# Patient Record
Sex: Female | Born: 1970 | Hispanic: Yes | Marital: Married | Smoking: Never smoker
Health system: Southern US, Community
[De-identification: ages and names within clinical notes are randomized; demographics above are authoritative.]

## PROBLEM LIST (undated history)

## (undated) HISTORY — PX: FOOT SURGERY: SHX648

---

## 2006-03-16 ENCOUNTER — Ambulatory Visit: Payer: Self-pay | Admitting: Internal Medicine

## 2006-03-31 ENCOUNTER — Ambulatory Visit: Payer: Self-pay | Admitting: Internal Medicine

## 2006-03-31 ENCOUNTER — Encounter: Payer: Self-pay | Admitting: Internal Medicine

## 2006-05-09 ENCOUNTER — Ambulatory Visit: Payer: Self-pay | Admitting: Family Medicine

## 2006-05-11 ENCOUNTER — Ambulatory Visit: Payer: Self-pay | Admitting: Family Medicine

## 2006-06-19 ENCOUNTER — Ambulatory Visit: Payer: Self-pay | Admitting: Family Medicine

## 2006-09-01 ENCOUNTER — Ambulatory Visit: Payer: Self-pay | Admitting: Internal Medicine

## 2007-03-09 ENCOUNTER — Emergency Department (HOSPITAL_COMMUNITY): Admission: EM | Admit: 2007-03-09 | Discharge: 2007-03-09 | Payer: Self-pay | Admitting: Emergency Medicine

## 2007-07-13 DIAGNOSIS — M6740 Ganglion, unspecified site: Secondary | ICD-10-CM | POA: Insufficient documentation

## 2007-09-06 ENCOUNTER — Other Ambulatory Visit: Admission: RE | Admit: 2007-09-06 | Discharge: 2007-09-06 | Payer: Self-pay | Admitting: Gynecology

## 2007-09-21 ENCOUNTER — Inpatient Hospital Stay (HOSPITAL_COMMUNITY): Admission: AD | Admit: 2007-09-21 | Discharge: 2007-09-21 | Payer: Self-pay | Admitting: Obstetrics & Gynecology

## 2007-09-23 ENCOUNTER — Inpatient Hospital Stay (HOSPITAL_COMMUNITY): Admission: AD | Admit: 2007-09-23 | Discharge: 2007-09-23 | Payer: Self-pay | Admitting: Obstetrics and Gynecology

## 2007-09-25 ENCOUNTER — Inpatient Hospital Stay (HOSPITAL_COMMUNITY): Admission: AD | Admit: 2007-09-25 | Discharge: 2007-09-25 | Payer: Self-pay | Admitting: Obstetrics & Gynecology

## 2007-10-02 ENCOUNTER — Inpatient Hospital Stay (HOSPITAL_COMMUNITY): Admission: RE | Admit: 2007-10-02 | Discharge: 2007-10-02 | Payer: Self-pay | Admitting: Obstetrics & Gynecology

## 2007-10-05 ENCOUNTER — Encounter: Payer: Self-pay | Admitting: Obstetrics & Gynecology

## 2007-10-05 ENCOUNTER — Ambulatory Visit (HOSPITAL_COMMUNITY): Admission: RE | Admit: 2007-10-05 | Discharge: 2007-10-05 | Payer: Self-pay | Admitting: Obstetrics & Gynecology

## 2007-10-05 ENCOUNTER — Ambulatory Visit: Payer: Self-pay | Admitting: Obstetrics & Gynecology

## 2007-10-11 ENCOUNTER — Inpatient Hospital Stay (HOSPITAL_COMMUNITY): Admission: AD | Admit: 2007-10-11 | Discharge: 2007-10-11 | Payer: Self-pay | Admitting: Obstetrics and Gynecology

## 2007-11-02 ENCOUNTER — Ambulatory Visit: Payer: Self-pay | Admitting: Gynecology

## 2007-12-25 ENCOUNTER — Ambulatory Visit: Payer: Self-pay | Admitting: Internal Medicine

## 2007-12-25 DIAGNOSIS — L723 Sebaceous cyst: Secondary | ICD-10-CM

## 2007-12-25 DIAGNOSIS — L293 Anogenital pruritus, unspecified: Secondary | ICD-10-CM | POA: Insufficient documentation

## 2007-12-25 LAB — CONVERTED CEMR LAB
KOH Prep: NEGATIVE
Whiff Test: NEGATIVE

## 2008-07-28 IMAGING — CR DG ABDOMEN ACUTE W/ 1V CHEST
3 series · 3 of 3 positions shown · non-contrast
Comparison: None.

CLINICAL DATA: Abdominal pain, question constipation.  
 ACUTE ABDOMINAL SERIES:

[view not recorded (1 of 3)]
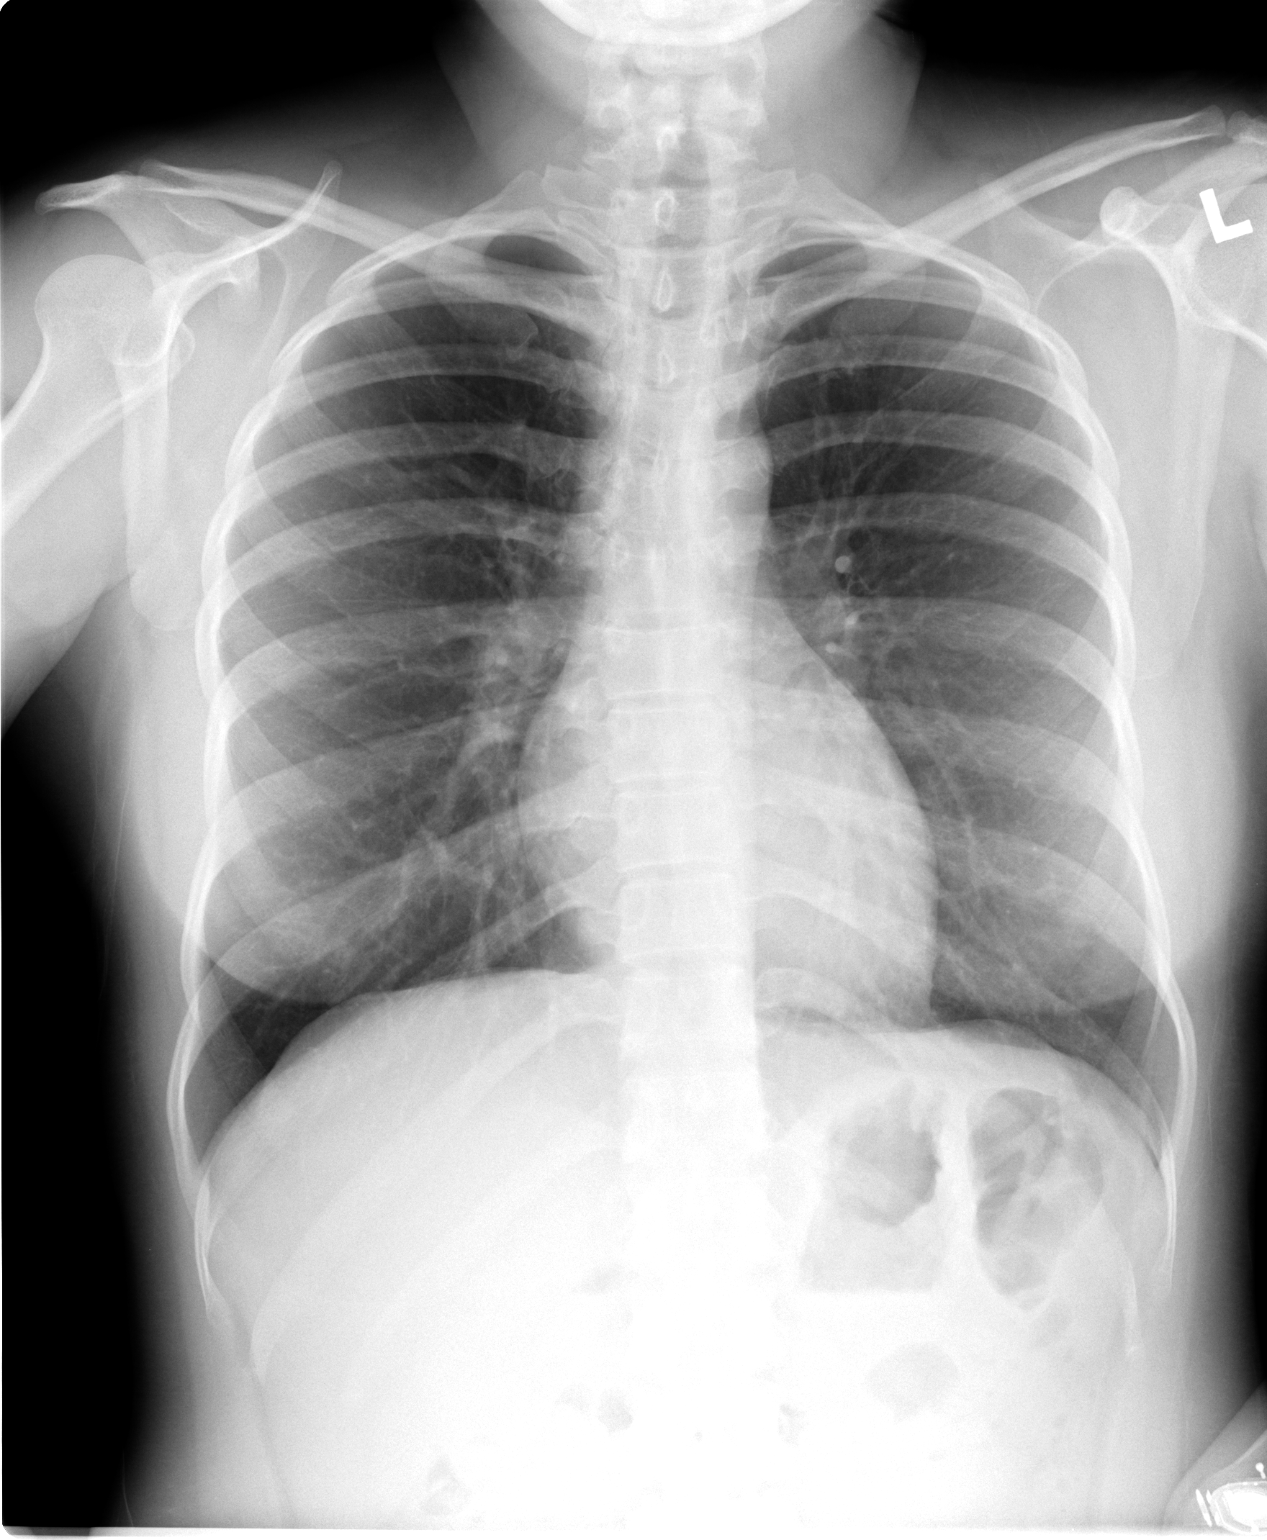

[view not recorded (2 of 3)]
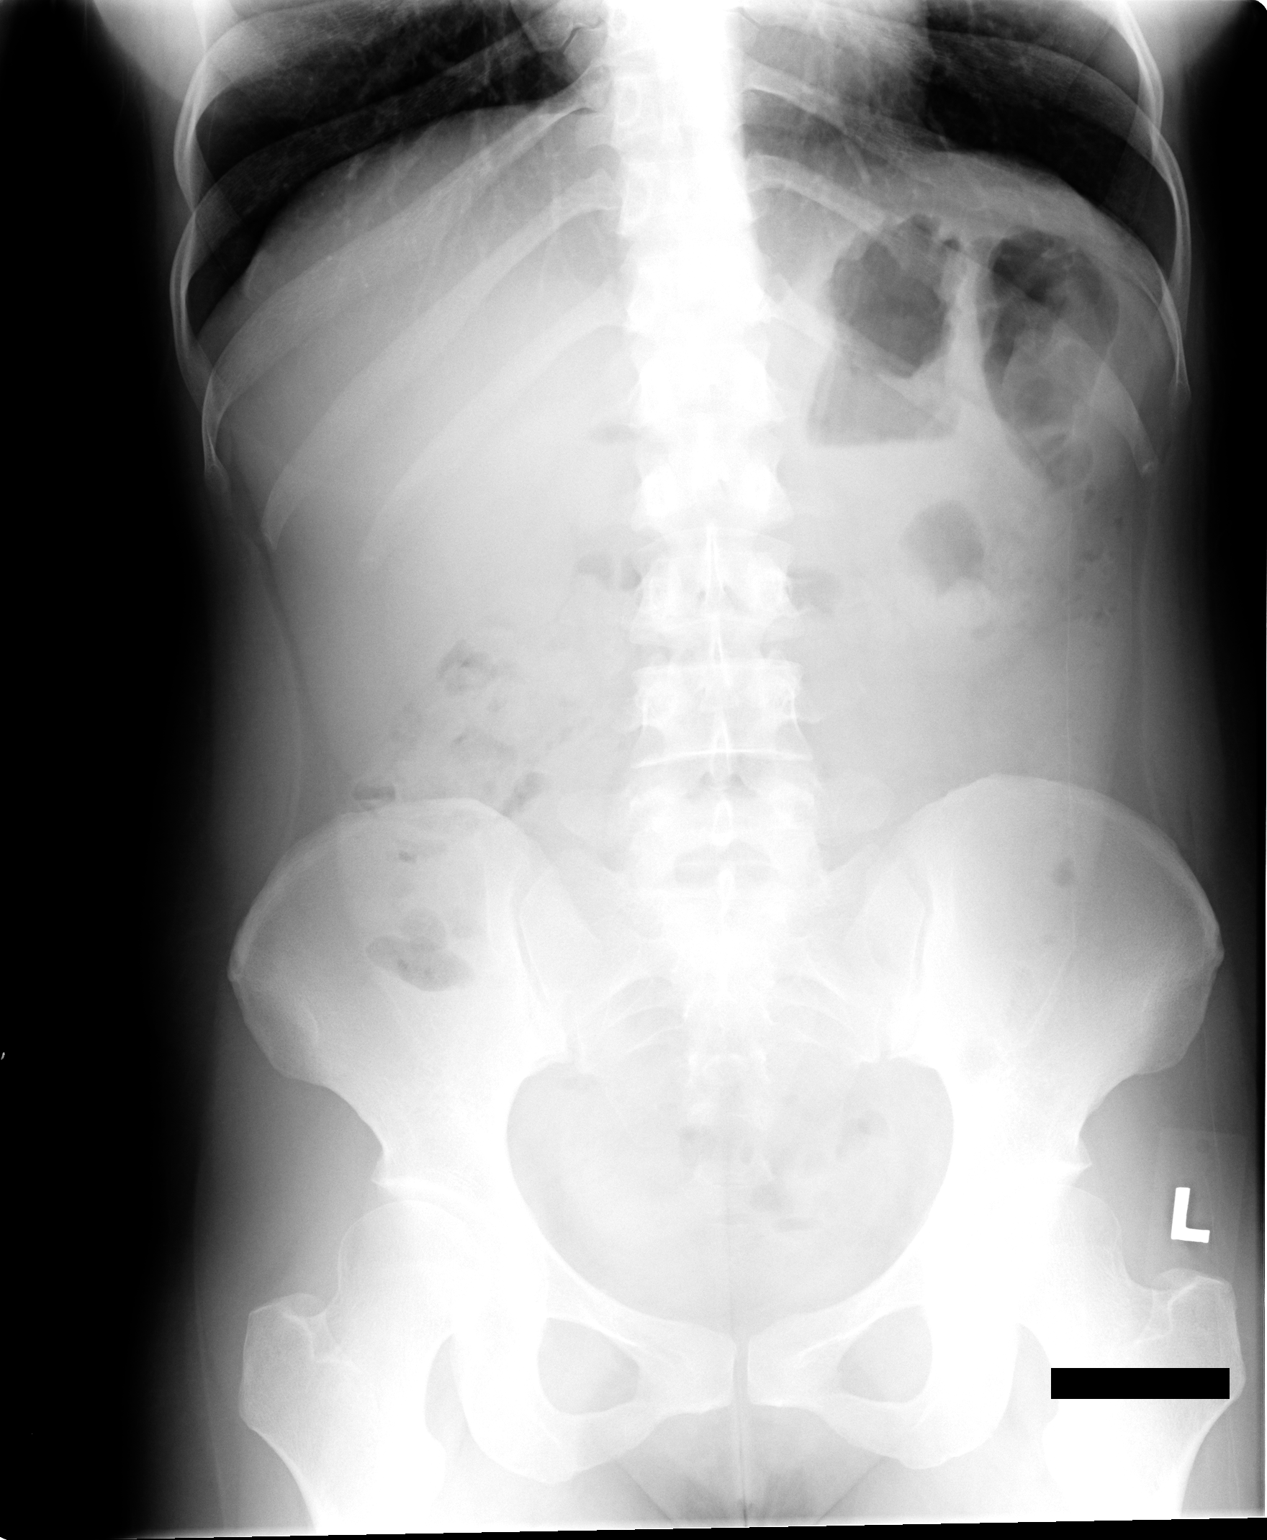

[view not recorded (3 of 3)]
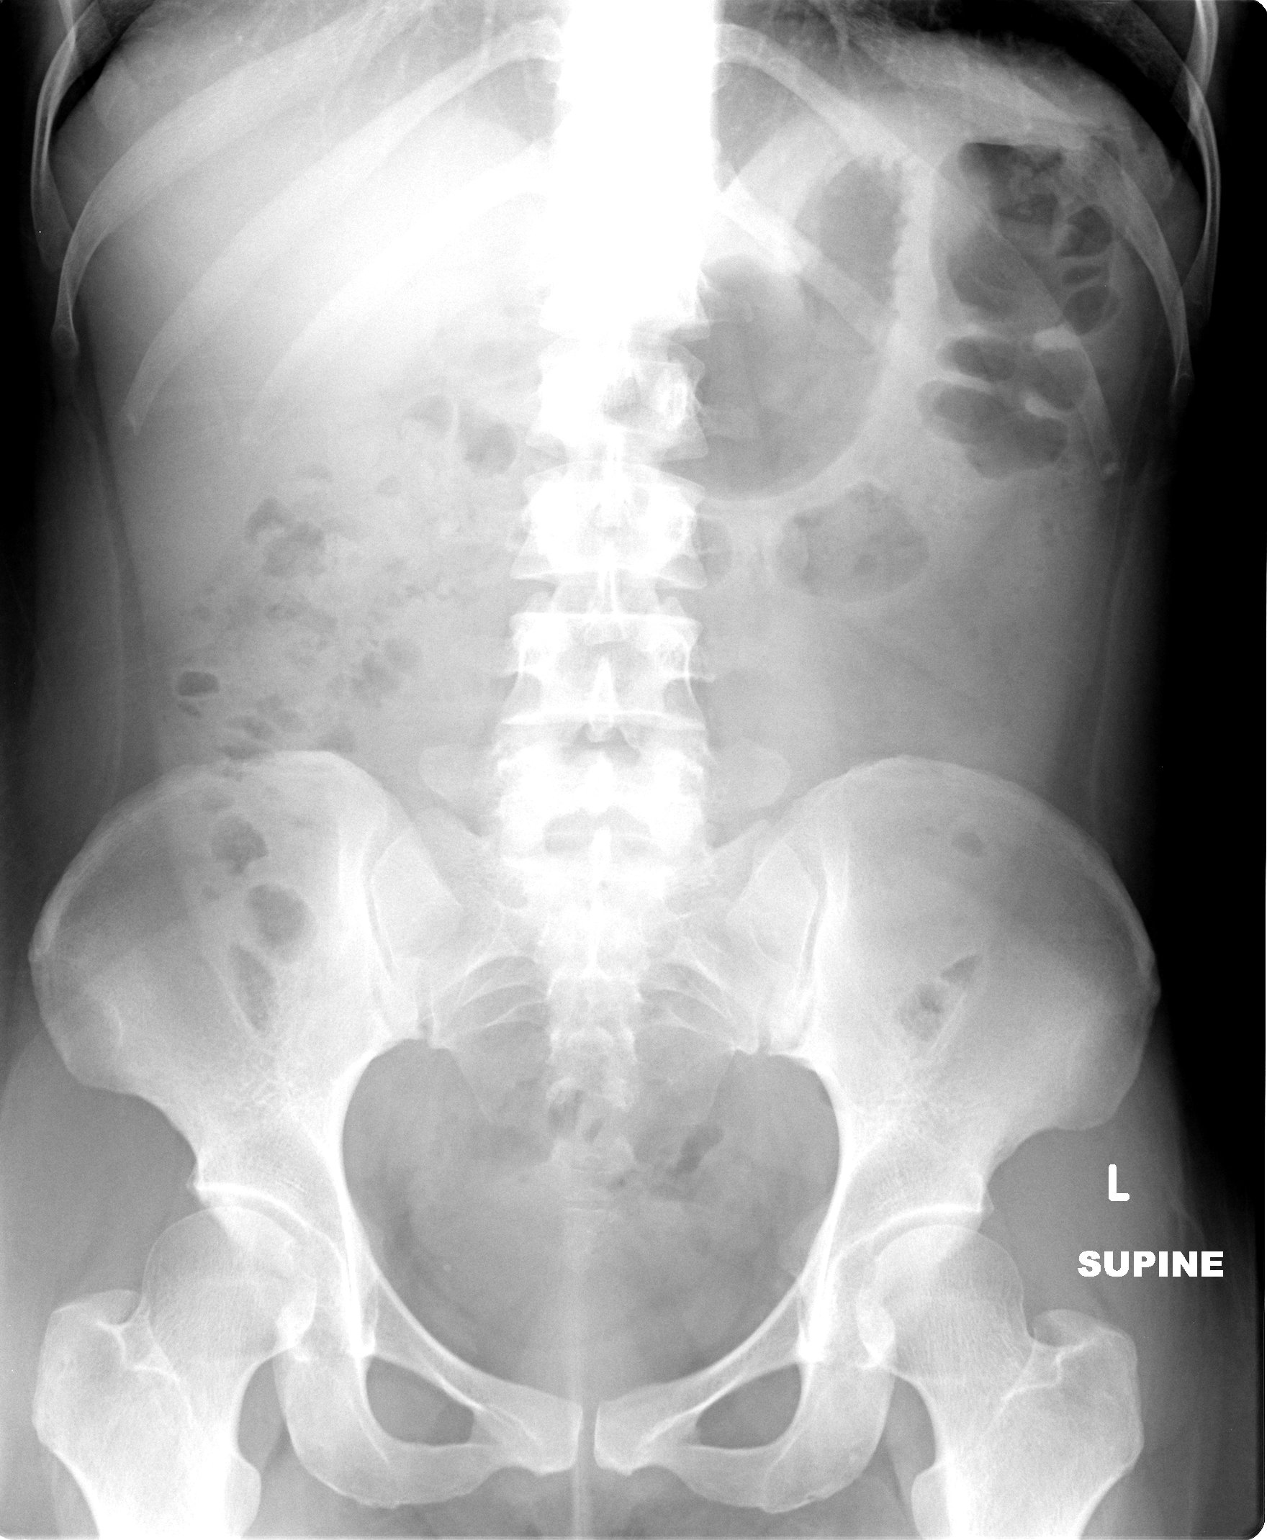

[3 of 3 positions shown; findings below may reference images not displayed]

FINDINGS: Single view of chest demonstrates clear lungs, normal heart size and no pleural effusion.  
 Two views of the abdomen demonstrate a normal bowel gas pattern and free intraperitoneal air.  Bowel gas pattern is normal with a mild to moderate stool volume.  No unexpected abdominal calcifications.
IMPRESSION: 1.  No acute finding.
 2.  Mild to moderate stool volume.

## 2008-12-01 ENCOUNTER — Inpatient Hospital Stay (HOSPITAL_COMMUNITY): Admission: AD | Admit: 2008-12-01 | Discharge: 2008-12-01 | Payer: Self-pay | Admitting: Obstetrics & Gynecology

## 2008-12-16 ENCOUNTER — Inpatient Hospital Stay (HOSPITAL_COMMUNITY): Admission: RE | Admit: 2008-12-16 | Discharge: 2008-12-20 | Payer: Self-pay | Admitting: Obstetrics

## 2008-12-17 ENCOUNTER — Encounter: Payer: Self-pay | Admitting: Obstetrics

## 2008-12-25 ENCOUNTER — Inpatient Hospital Stay (HOSPITAL_COMMUNITY): Admission: AD | Admit: 2008-12-25 | Discharge: 2008-12-29 | Payer: Self-pay | Admitting: Obstetrics & Gynecology

## 2011-02-15 LAB — CBC
HCT: 20.1 % — ABNORMAL LOW (ref 36.0–46.0)
HCT: 21.4 % — ABNORMAL LOW (ref 36.0–46.0)
HCT: 25.6 % — ABNORMAL LOW (ref 36.0–46.0)
HCT: 32.1 % — ABNORMAL LOW (ref 36.0–46.0)
Hemoglobin: 11.3 g/dL — ABNORMAL LOW (ref 12.0–15.0)
Hemoglobin: 6.9 g/dL — CL (ref 12.0–15.0)
Hemoglobin: 7.2 g/dL — CL (ref 12.0–15.0)
MCHC: 33.6 g/dL (ref 30.0–36.0)
MCHC: 33.7 g/dL (ref 30.0–36.0)
MCHC: 33.9 g/dL (ref 30.0–36.0)
MCHC: 34 g/dL (ref 30.0–36.0)
MCHC: 34.5 g/dL (ref 30.0–36.0)
MCV: 97.9 fL (ref 78.0–100.0)
MCV: 98.1 fL (ref 78.0–100.0)
MCV: 98.3 fL (ref 78.0–100.0)
MCV: 98.3 fL (ref 78.0–100.0)
MCV: 98.8 fL (ref 78.0–100.0)
Platelets: 183 10*3/uL (ref 150–400)
Platelets: 333 10*3/uL (ref 150–400)
RBC: 2.05 MIL/uL — ABNORMAL LOW (ref 3.87–5.11)
RBC: 2.18 MIL/uL — ABNORMAL LOW (ref 3.87–5.11)
RBC: 2.48 MIL/uL — ABNORMAL LOW (ref 3.87–5.11)
RBC: 3.28 MIL/uL — ABNORMAL LOW (ref 3.87–5.11)
RBC: 3.44 MIL/uL — ABNORMAL LOW (ref 3.87–5.11)
RDW: 14.7 % (ref 11.5–15.5)
RDW: 14.8 % (ref 11.5–15.5)
RDW: 15.4 % (ref 11.5–15.5)
WBC: 18.9 10*3/uL — ABNORMAL HIGH (ref 4.0–10.5)
WBC: 6.7 10*3/uL (ref 4.0–10.5)

## 2011-02-15 LAB — COMPREHENSIVE METABOLIC PANEL
AST: 16 U/L (ref 0–37)
Alkaline Phosphatase: 127 U/L — ABNORMAL HIGH (ref 39–117)
BUN: 17 mg/dL (ref 6–23)
CO2: 22 mEq/L (ref 19–32)
Calcium: 8.6 mg/dL (ref 8.4–10.5)
Creatinine, Ser: 0.55 mg/dL (ref 0.4–1.2)
GFR calc Af Amer: >60 mL/min (ref >60)
GFR calc non Af Amer: >60 mL/min (ref >60)
GFR calc non Af Amer: >60 mL/min (ref >60)
Glucose, Bld: 171 mg/dL — ABNORMAL HIGH (ref 70–99)
Glucose, Bld: 85 mg/dL (ref 70–99)
Potassium: 3.9 mEq/L (ref 3.5–5.1)
Sodium: 130 mEq/L — ABNORMAL LOW (ref 135–145)
Total Bilirubin: 0.3 mg/dL (ref 0.3–1.2)
Total Protein: 5.6 g/dL — ABNORMAL LOW (ref 6.0–8.3)

## 2011-02-15 LAB — DIFFERENTIAL
Basophils Absolute: 0 10*3/uL (ref 0.0–0.1)
Basophils Absolute: 0 10*3/uL (ref 0.0–0.1)
Basophils Relative: 0 % (ref 0–1)
Basophils Relative: 0 % (ref 0–1)
Eosinophils Absolute: 0.1 10*3/uL (ref 0.0–0.7)
Eosinophils Relative: 1 % (ref 0–5)
Lymphocytes Relative: 14 % (ref 12–46)
Lymphocytes Relative: 16 % (ref 12–46)
Monocytes Absolute: 0.4 10*3/uL (ref 0.1–1.0)
Monocytes Relative: 4 % (ref 3–12)
Monocytes Relative: 5 % (ref 3–12)
Neutro Abs: 9.1 10*3/uL — ABNORMAL HIGH (ref 1.7–7.7)
Neutrophils Relative %: 78 % — ABNORMAL HIGH (ref 43–77)

## 2011-02-15 LAB — TYPE AND SCREEN: Antibody Screen: NEGATIVE

## 2011-02-15 LAB — URIC ACID: Uric Acid, Serum: 3.7 mg/dL (ref 2.4–7.0)

## 2011-02-15 LAB — BASIC METABOLIC PANEL
BUN: 11 mg/dL (ref 6–23)
GFR calc non Af Amer: >60 mL/min (ref >60)
Glucose, Bld: 169 mg/dL — ABNORMAL HIGH (ref 70–99)
Potassium: 3.9 mEq/L (ref 3.5–5.1)
Sodium: 139 mEq/L (ref 135–145)

## 2011-02-15 LAB — RPR: RPR Ser Ql: NONREACTIVE

## 2011-03-15 NOTE — Discharge Summary (Signed)
NAME:  Kelly Rogers, Kelly Rogers   ACCOUNT NO.:  [id number]   MEDICAL RECORD NO.:  [id number]          PATIENT TYPE:  INP   LOCATION:  9114                          FACILITY:  [facility]   PHYSICIAN:  Charles A. Clearance Coots, M.D.DATE OF BIRTH:  11/17/1970   DATE OF ADMISSION:  12/16/2008  DATE OF DISCHARGE:  12/20/2008                               DISCHARGE SUMMARY   ADMITTING DIAGNOSES:  Post dates pregnancy, induction of labor.   DISCHARGE DIAGNOSES:  Post dates pregnancy, induction of labor, status  post primary low transverse cesarean section on December 17, 2008 for  arrest of descent.  Viable female infant was delivered at 0938, Apgars  of 9 at 1 minute, 9 at 5 minutes, weight of 35, 50 g, length of 53.34  cm.  Mother and infant discharged home in good condition.   REASON FOR ADMISSION:  A 40 year old G2, P0, estimated date of  confinement of December 12, 2008 presents for induction of labor for  postdates.  Prenatal care was uncomplicated.  Group B strep negative.   PAST MEDICAL HISTORY:  Surgery cyst on foot resected.   ILLNESSES:  None.   MEDICATIONS:  Prenatal vitamins.   ALLERGIES:  No known drug allergies.   SOCIAL HISTORY:  Married.  Negative tobacco, alcohol, or recreational  drug use.   FAMILY HISTORY:  Positive for diabetes, colon cancer, and renal disease.   PHYSICAL EXAMINATION:  GENERAL:  Well-nourished, well-developed female  in no acute distress.  Afebrile.  VITAL SIGNS:  Stable.  LUNGS:  Clear to auscultation bilaterally.  HEART:  Regular rate and rhythm.  ABDOMEN:  Gravid, nontender.  Cervix long, closed, and vertex at -3  station.   ADMITTING LABS:  Hemoglobin 11, hematocrit 33, white blood cell count  6700, platelets 180,000.  Comprehensive metabolic panel was within  normal limits.  RPR was nonreactive.   HOSPITAL COURSE:  The patient was admitted and cervical ripening was  started with Foley bulb which was inserted in routine fashion.  The  patient was  ruptured after Foley bulb was removed and cervix was 4-5 cm  dilated, 60-70% effaced, and the vertex was still at -3 station.  The  patient continued in active labor to full to 9 cm dilatation and made no  further progress with either dilation or descent for greater than 3-4  hours after that point with adequate labor.  A decision was made to  proceed with cesarean section delivery for arrest of descent.  Primary  low transverse cesarean section was performed on December 17, 2008.  There were no intraoperative complications.  Postoperative course was  uncomplicated.  The patient was discharged home on postop day #3 in good  condition.   DISCHARGE LABS:  Hemoglobin 8.7, hematocrit 25.6, white blood cell count  9800, platelets 149,000.  The patient did have anemia postoperatively,  but had no significant orthostatic changes and was able to ambulate and  take care of the baby without any dizziness, or weakness, or headache.  She therefore did not require transfusion of blood products and was  discharged home on iron therapy.   DISCHARGE DISPOSITION:   MEDICATIONS:  Percocet was prescribed for pain.  Continue prenatal  vitamins.  Iron was prescribed for anemia.  Routine written instructions  were given for discharge after cesarean section.  The patient is to call  office for followup appointment in 2 weeks      Charles A. Clearance Coots, M.D.  Electronically Signed     CAH/MEDQ  D:  12/20/2008  T:  12/20/2008  Job:  [id number]

## 2011-03-15 NOTE — Op Note (Signed)
NAME:  KINDALL, SWABY   ACCOUNT NO.:  [id number]   MEDICAL RECORD NO.:  [id number]          PATIENT TYPE:  AMB   LOCATION:  SDC                           FACILITY:  [facility]   PHYSICIAN:  Norton Blizzard, MD    DATE OF BIRTH:  1971/01/19   DATE OF PROCEDURE:  10/05/2007  DATE OF DISCHARGE:                               OPERATIVE REPORT   PREOPERATIVE DIAGNOSIS:  Missed abortion at six weeks.   POSTOPERATIVE DIAGNOSIS:  Missed abortion at six weeks.   PROCEDURE:  Suction dilation and curettage.   SURGEON:  Norton Blizzard, M.D.   ANESTHESIA:  General and local.   IV FLUIDS:  850 cc of lactated Ringer's.   ESTIMATED BLOOD LOSS:  Minimal.   FINDINGS:  A six week size anteverted uterus, a moderate amount of  products of conception.   SPECIMENS:  Products of conception sent to pathology.   COMPLICATIONS:  None.   PROCEDURE IN DETAIL:  The patient is a 40 year old woman who was  recently diagnosed with missed abortion at six weeks.  Patient opted for  surgical management.  The risks, benefits, indications, and alternatives  for surgery were discussed with the patient, and written informed  consent was obtained.  Patient was taken to the operating room where  general anesthesia was placed and found to be adequate.  Of note, she  did receive doxycycline 100 mg IV x1 prior to the procedure.  She was  then placed in a dorsal lithotomy position and prepped and draped in a  sterile manner.  A Foley catheter was used to empty the patient's  bladder.  Attention was then turned to the patient's vagina, where a  speculum was placed.  The cervix was identified and grasped with a  tenaculum.  A paracervical block was administered using 20 cc of 0.25%  Marcaine with epinephrine.  The cervical os was then dilated to  accommodate 7 mm curette.  This curette was gently advanced into the  uterus fundus, and a suction machine was activated.  The curette was  slowly rotated to clear the  uterus of all products of conception and  debris.  About two passes were made, and the uterus was noted to be  clamped down.  Sharp curettage was then performed to confirm emptying of  the uterus, and a suction curette was introduced one last time to clear  all of the blood and clots and remaining products.  The tenaculum was removed.  Overall good hemostasis was noted  All  instruments were removed from the patient's vagina.  Patient tolerated  the procedure well.  Sponge, needle, lap counts were correct x2.  Patient was taken to the recovery room in stable condition.      Norton Blizzard, MD  Electronically Signed     UAD/MEDQ  D:  10/05/2007  T:  10/05/2007  Job:  [id number]

## 2011-03-15 NOTE — Op Note (Signed)
NAME:  Kelly Rogers, Kelly Rogers   ACCOUNT NO.:  [id number]   MEDICAL RECORD NO.:  [id number]          PATIENT TYPE:  INP   LOCATION:  9114                          FACILITY:  [facility]   PHYSICIAN:  Charles A. Clearance Coots, M.D.DATE OF BIRTH:  June 07, 1971   DATE OF PROCEDURE:  12/17/2008  DATE OF DISCHARGE:  12/20/2008                               OPERATIVE REPORT   PREOPERATIVE DIAGNOSIS:  Arrest of descent.   POSTOPERATIVE DIAGNOSIS:  Arrest of descent.   PROCEDURE:  Primary low-transverse cesarean section.   SURGEON:  Charles A. Clearance Coots, MD   ANESTHESIA:  Epidural.   ESTIMATED BLOOD LOSS:  1000 mL.   IV FLUIDS:  2600 mL.   URINE OUTPUT:  300 mL Foley to gravity.   FINDINGS:  Viable female at 0938, Apgars of 9 at one minute and 9 at  five minutes, weight of 7 pounds and 13 ounces.  Normal uterus, ovaries,  and fallopian tubes.   COMPLICATIONS:  None.   SPECIMEN:  Placenta.   DISPOSITION:  Specimen to Pathology.   OPERATION:  The patient was brought to the operating room and after  satisfactory redosing of the epidural, the abdomen was prepped and  draped in the usual sterile fashion.  Pfannenstiel skin incision was  made with a scalpel that was deepened down to the fascia with a scalpel.  Fascia was nicked in the midline and the fascial incision was extended  to the left and to right with curved Mayo scissors.  The superior and  inferior fascial edges were taken off of the rectus muscles both blunt  and sharp dissection.  The rectus muscle was bluntly and sharply divided  in the midline.  Peritoneum was entered digitally and was digitally  extended to the left and to the right.  The bladder blade was positioned  in the incision and the vesicouterine fold of peritoneum above the  reflection of the urinary bladder was grasped with forceps and was  incised and undermined with Metzenbaum scissors.  The incision was  extended to the left and to the right with Metzenbaum scissors.   The  bladder flap was bluntly developed and the bladder blade was  repositioned in front of the urinary bladder placing it well out of the  operative field.  The uterus was then entered transversely in the lower  uterine segment with a scalpel.  Clear amniotic fluid was expelled.  The  uterine incision was extended to the left and to the right digitally.  The vertex was then brought up into the incision and the vertex was  delivered with the aid of fundal pressure from the assistant.  The  infant's mouth and nose were suctioned with a suction bulb and the  delivery was completed with the aid of fundal pressure from the  assistant.  The umbilical cord was doubly clamped and cut and the infant  was handed off to the nursery staff.  Cord blood was obtained and the  placenta was spontaneously expelled from the uterine cavity intact.  The  endometrial surface was thoroughly debrided with a dry lap sponge.  The  edges of the uterine incision were grasped with ring forceps.  The  uterus was closed with continuous interlocking suture of 0-Monocryl from  each corner to the center.  Hemostasis was excellent.  The pelvic cavity  was thoroughly irrigated with warm saline solution and all clots were  removed.  The abdomen was then closed as follows.  The parietal  peritoneum was closed with continuous suture of 2-0 Monocryl.  The  fascia was closed with continuous suture of 0-Vicryl.  Subcutaneous  tissue was thoroughly irrigated with warm saline solution.  All areas of  subcutaneous bleeding were coagulated with the Bovie.  Skin was then  closed with surgical stainless steel staples.  Sterile bandage was  applied to the incision closure.  The surgical technician indicated that  all needle, sponge, and instrument counts were correct x2.  The patient  tolerated the procedure well, transported to recovery room in  satisfactory condition.      Charles A. Clearance Coots, M.D.  Electronically Signed      CAH/MEDQ  D:  02/09/2009  T:  02/10/2009  Job:  [id number]

## 2011-03-15 NOTE — Discharge Summary (Signed)
NAME:  Kelly Rogers, Kelly Rogers   ACCOUNT NO.:  [id number]   MEDICAL RECORD NO.:  [id number]          PATIENT TYPE:  INP   LOCATION:  9319                          FACILITY:  [facility]   PHYSICIAN:  Charles A. Clearance Coots, M.D.DATE OF BIRTH:  Oct 29, 1971   DATE OF ADMISSION:  12/25/2008  DATE OF DISCHARGE:  12/29/2008                               DISCHARGE SUMMARY   ADMITTING DIAGNOSES:  Uterine subinvolution postpartum, status post  cesarean section.   DISCHARGE DIAGNOSES:  Uterine subinvolution postpartum, status post  cesarean section, much improved after uterotonic therapy, discharged  home in good condition.   REASON FOR ADMISSION:  A 40 year old female presented with a chief  complaint of vaginal bleeding.  The patient is G2, P1-0-1-1, status post  cesarean section delivery on December 17, 2008.  The patient had  bleeding off and on since delivery, but on the day of admission began  having heavy bleeding per vagina passing clots and with cramping.  The  patient stated that she was feeling weak and dizzy.  On admission prior  to her cesarean section, the patient's hemoglobin was 11 and on  discharge after cesarean section, hemoglobin was 8.7.  The patient was  discharged home on iron.   PAST MEDICAL HISTORY:  Surgery, cesarean section, D and C, left foot  surgery.   ILLNESSES:  None.   MEDICATIONS:  Prenatal vitamins, iron, Percocet, and ibuprofen.   ALLERGIES:  No known drug allergies.   SOCIAL HISTORY:  Married.  Negative tobacco, alcohol, or recreational  drug use.   FAMILY HISTORY:  Positive for diabetes and hypertension.   PHYSICAL EXAMINATION:  GENERAL:  A well-nourished, well-developed female  in no acute distress.  VITAL SIGNS:  She is afebrile.  Vital signs were stable.  LUNGS:  Clear to auscultation bilaterally.  HEART:  Regular rate and rhythm.  ABDOMEN:  Diffuse tenderness.  SKIN:  Incision appears clean, dry, intact, and nontender.  PELVIC:  Moderate amount of  blood in the vagina with large amount of  clots removed.  Uterus was enlarged and tender.   ADMITTING LABS:  Hemoglobin 7.2, hematocrit 21.4, white blood cell count  18,900, and platelets 363,000.  Comprehensive metabolic panel was within  normal limits except for elevated glucose.   HOSPITAL COURSE:  The patient was admitted and transfused 2 units of  packed red blood cells.  She was also started on Unasyn IV for possible  endometritis.  Hemabate was also given for the uterine subinvolution.  The patient responded well to therapy.  Ultrasound was obtained and was  suggestive of retained products of conception, but the patient did well  clinically with no further vaginal bleeding after admission and D and C  was not done.  She continued to do well with no further vaginal bleeding  and post-transfusion hemoglobin was 8 and her hematocrit was 23.  The  patient remained stable and had no further vaginal bleeding and was  therefore discharged home on hospital day #7, much improved, in good  condition.   DISCHARGE LABS:  Hemoglobin 8, hematocrit 23.6, white blood cell count  11,700, and platelets 380,000.   DISCHARGE DISPOSITION:  Medications:  Continue prenatal  vitamins.  Iron  was prescribed for anemia.  Continue ibuprofen and Percocet for pain.  The patient was given precautions for heavy vaginal bleeding to return  to the hospital if the onset of heavy vaginal bleeding occurs.  She is  to call office for followup appointment in 1 week.      Charles A. Clearance Coots, M.D.  Electronically Signed     CAH/MEDQ  D:  02/09/2009  T:  02/10/2009  Job:  [id number]

## 2011-08-08 LAB — CBC
Hemoglobin: 12.4
RBC: 3.81 — ABNORMAL LOW

## 2011-08-09 LAB — POCT PREGNANCY, URINE
Operator id: 220991
Preg Test, Ur: POSITIVE

## 2011-08-09 LAB — CBC
MCHC: 35.4
MCV: 93.2
RBC: 3.5 — ABNORMAL LOW

## 2011-08-09 LAB — URINE MICROSCOPIC-ADD ON

## 2011-08-09 LAB — WET PREP, GENITAL: Yeast Wet Prep HPF POC: NONE SEEN

## 2011-08-09 LAB — URINALYSIS, ROUTINE W REFLEX MICROSCOPIC
Leukocytes, UA: NEGATIVE
Nitrite: NEGATIVE
Specific Gravity, Urine: 1.015
Urobilinogen, UA: 0.2

## 2011-08-09 LAB — DIFFERENTIAL
Basophils Relative: 1
Monocytes Absolute: 0.4
Monocytes Relative: 9
Neutro Abs: 2.1

## 2011-08-09 LAB — HCG, QUANTITATIVE, PREGNANCY
hCG, Beta Chain, Quant, S: 7739 — ABNORMAL HIGH
hCG, Beta Chain, Quant, S: 7898 — ABNORMAL HIGH

## 2011-08-09 LAB — CREATININE, SERUM: GFR calc Af Amer: >60

## 2011-08-09 LAB — GC/CHLAMYDIA PROBE AMP, GENITAL: GC Probe Amp, Genital: NEGATIVE

## 2012-05-07 ENCOUNTER — Encounter (HOSPITAL_COMMUNITY): Payer: Self-pay | Admitting: Emergency Medicine

## 2012-05-07 ENCOUNTER — Emergency Department (HOSPITAL_COMMUNITY): Admission: EM | Admit: 2012-05-07 | Discharge: 2012-05-07 | Discharge status: Absent without leave | Payer: Self-pay

## 2012-05-07 ENCOUNTER — Emergency Department (INDEPENDENT_AMBULATORY_CARE_PROVIDER_SITE_OTHER)
Admission: EM | Admit: 2012-05-07 | Discharge: 2012-05-07 | Disposition: A | Discharge status: Home / Self Care | Payer: Self-pay | Source: Home / Self Care | Attending: Emergency Medicine | Admitting: Emergency Medicine

## 2012-05-07 DIAGNOSIS — K8020 Calculus of gallbladder without cholecystitis without obstruction: Secondary | ICD-10-CM

## 2012-05-07 LAB — POCT URINALYSIS DIP (DEVICE)
Hgb urine dipstick: NEGATIVE
Ketones, ur: NEGATIVE mg/dL
Leukocytes, UA: NEGATIVE
Protein, ur: NEGATIVE mg/dL
Specific Gravity, Urine: 1.01 (ref 1.005–1.030)
pH: 5.5 (ref 5.0–8.0)

## 2012-05-07 MED ORDER — ONDANSETRON 8 MG PO TBDP: ORAL_TABLET | ORAL | Status: AC

## 2012-05-07 MED ORDER — IBUPROFEN 600 MG PO TABS: 600.0000 mg | ORAL_TABLET | Freq: Four times a day (QID) | ORAL | Status: AC | PRN

## 2012-05-07 NOTE — ED Notes (Signed)
Pt here with intermit right mid abdominal sharp pain radiating to lower back that started x ago.denies n/v/d.lmp 04/22/12.

## 2012-05-07 NOTE — ED Provider Notes (Signed)
History     CSN: [id number]  Arrival date & time 05/07/12  1228   First MD Initiated Contact with Patient 05/07/12 1304      Chief Complaint  Patient presents with  . Abdominal Pain  . Back Pain    (Consider location/radiation/quality/duration/timing/severity/associated sxs/prior treatment) HPI Comments: Patient reports intermittent, colicky, waxing and waning "squeezing" pain in her right upper quadrant starting several hours after eating. Last for about 5-10 minutes and then resolve. Last night she states that it lasted for for hours. States it occasionally radiates to her back. No nausea, vomiting, fevers. Pain is not associated with any specific food. It is not associated with fasting, movement, urination, defecation. No CP, unintentional weight loss, abdominal distention, change in bowel habit. No urinary complaints. No history of abdominal surgeries.  ROS as noted in HPI. All other ROS negative.   Patient is a 41 y.o. female presenting with abdominal pain and back pain. The history is provided by the patient. No language interpreter was used.  Abdominal Pain The primary symptoms of the illness include abdominal pain. The current episode started more than 2 days ago. The onset of the illness was sudden. The problem has not changed since onset. The abdominal pain is located in the RUQ. The abdominal pain radiates to the back. The abdominal pain is relieved by nothing. The abdominal pain is exacerbated by eating.  The illness is associated with eating. The patient states that she believes she is currently not pregnant. The patient has not had a change in bowel habit. Additional symptoms associated with the illness include back pain. Symptoms associated with the illness do not include chills, anorexia, diaphoresis, heartburn, constipation, urgency or frequency. Significant associated medical issues do not include PUD, GERD, diabetes, gallstones, liver disease, diverticulitis or cardiac  disease.  Back Pain  Associated symptoms include abdominal pain.    History reviewed. No pertinent past medical history.  Past Surgical History  Procedure Date  . Foot surgery   . Cesarean section     History reviewed. No pertinent family history.  History  Substance Use Topics  . Smoking status: Never Smoker   . Smokeless tobacco: Not on file  . Alcohol Use: No    OB History    Grav Para Term Preterm Abortions TAB SAB Ect Mult Living                  Review of Systems  Constitutional: Negative for chills and diaphoresis.  Gastrointestinal: Positive for abdominal pain. Negative for heartburn, constipation and anorexia.  Genitourinary: Negative for urgency and frequency.  Musculoskeletal: Positive for back pain.    Allergies  Review of patient's allergies indicates no known allergies.  Home Medications   Current Outpatient Rx  Name Route Sig Dispense Refill  . IBUPROFEN 600 MG PO TABS Oral Take 1 tablet (600 mg total) by mouth every 6 (six) hours as needed for pain. 30 tablet 0  . ONDANSETRON 8 MG PO TBDP  1/2- 1 tablet q 8 hr prn nausea, vomiting 20 tablet 0    BP 113/72  Pulse 55  Temp 98.6 F (37 C) (Oral)  Resp 18  SpO2 100%  LMP 04/22/2012  Physical Exam  Nursing note and vitals reviewed. Constitutional: She is oriented to person, place, and time. She appears well-developed and well-nourished. No distress.  HENT:  Head: Normocephalic and atraumatic.  Eyes: Conjunctivae and EOM are normal.  Neck: Normal range of motion.  Cardiovascular: Normal rate, regular rhythm and normal  heart sounds.   Pulmonary/Chest: Effort normal and breath sounds normal.  Abdominal: Soft. Bowel sounds are normal. She exhibits no distension and no mass. There is tenderness in the right upper quadrant. There is no rigidity, no rebound, no guarding, no CVA tenderness, no tenderness at McBurney's point and negative Murphy's sign.       mild right upper quadrant tenderness with  deep palpation  Musculoskeletal: Normal range of motion.  Neurological: She is alert and oriented to person, place, and time. Coordination normal.  Skin: Skin is warm and dry.  Psychiatric: She has a normal mood and affect. Her behavior is normal. Judgment and thought content normal.    ED Course  Procedures (including critical care time)   Labs Reviewed  POCT URINALYSIS DIP (DEVICE)  POCT PREGNANCY, URINE   No results found.   1. Cholelithiasis    Results for orders placed during the hospital encounter of 05/07/12  POCT URINALYSIS DIP (DEVICE)      Component Value Range   Glucose, UA NEGATIVE  NEGATIVE mg/dL   Bilirubin Urine NEGATIVE  NEGATIVE   Ketones, ur NEGATIVE  NEGATIVE mg/dL   Specific Gravity, Urine 1.010  1.005 - 1.030   Hgb urine dipstick NEGATIVE  NEGATIVE   pH 5.5  5.0 - 8.0   Protein, ur NEGATIVE  NEGATIVE mg/dL   Urobilinogen, UA 0.2  0.0 - 1.0 mg/dL   Nitrite NEGATIVE  NEGATIVE   Leukocytes, UA NEGATIVE  NEGATIVE  POCT PREGNANCY, URINE      Component Value Range   Preg Test, Ur NEGATIVE  NEGATIVE    MDM  Pt abd exam is benign, no peritoneal signs. No evidence of surgical abd. Doubt SBO, mesenteric ischemia, appendicitis, hepatitis, cholecystitis, pancreatitis, or perforated viscus. No evidence to support or suggest GYN pathology such as ovarian torsion or infection.  Will refer to primary care resources. She will need an outpatient ultrasound of her gallbladder. Discussed signs and symptoms that should prompt return to the department. She and her spouse agree with plan.   Luiz Blare, MD 05/07/12 1630

## 2014-03-06 ENCOUNTER — Other Ambulatory Visit (HOSPITAL_COMMUNITY): Payer: Self-pay | Admitting: Physician Assistant

## 2014-03-06 DIAGNOSIS — Z1231 Encounter for screening mammogram for malignant neoplasm of breast: Secondary | ICD-10-CM

## 2014-03-10 ENCOUNTER — Ambulatory Visit (HOSPITAL_COMMUNITY): Admission: RE | Admit: 2014-03-10 | Payer: 59 | Source: Ambulatory Visit

## 2014-03-13 ENCOUNTER — Ambulatory Visit (HOSPITAL_COMMUNITY): Payer: 59

## 2014-03-25 ENCOUNTER — Ambulatory Visit (HOSPITAL_COMMUNITY)
Admission: RE | Admit: 2014-03-25 | Discharge: 2014-03-25 | Disposition: A | Discharge status: Home / Self Care | Payer: 59 | Source: Ambulatory Visit | Attending: Physician Assistant | Admitting: Physician Assistant

## 2014-03-25 DIAGNOSIS — Z1231 Encounter for screening mammogram for malignant neoplasm of breast: Secondary | ICD-10-CM

## 2014-04-30 ENCOUNTER — Other Ambulatory Visit: Payer: Self-pay | Admitting: *Deleted

## 2014-04-30 DIAGNOSIS — R102 Pelvic and perineal pain: Secondary | ICD-10-CM

## 2014-05-08 ENCOUNTER — Ambulatory Visit (HOSPITAL_COMMUNITY)
Admission: RE | Admit: 2014-05-08 | Discharge: 2014-05-08 | Disposition: A | Discharge status: Home / Self Care | Payer: 59 | Source: Ambulatory Visit | Attending: Obstetrics & Gynecology | Admitting: Obstetrics & Gynecology

## 2014-05-08 DIAGNOSIS — N72 Inflammatory disease of cervix uteri: Secondary | ICD-10-CM | POA: Insufficient documentation

## 2014-05-08 DIAGNOSIS — R102 Pelvic and perineal pain: Secondary | ICD-10-CM

## 2014-05-08 DIAGNOSIS — R1031 Right lower quadrant pain: Secondary | ICD-10-CM | POA: Insufficient documentation

## 2014-05-19 ENCOUNTER — Telehealth: Payer: Self-pay | Admitting: *Deleted

## 2014-05-19 NOTE — Telephone Encounter (Signed)
Pt called clinic for ultrasound results/  Contacted patient, results given. Pt has no further questions.

## 2015-07-30 ENCOUNTER — Ambulatory Visit: Payer: Self-pay | Attending: Internal Medicine

## 2015-07-30 ENCOUNTER — Encounter (INDEPENDENT_AMBULATORY_CARE_PROVIDER_SITE_OTHER): Payer: Self-pay

## 2015-08-05 ENCOUNTER — Ambulatory Visit: Payer: Self-pay

## 2015-08-06 DIAGNOSIS — Z2839 Other underimmunization status: Secondary | ICD-10-CM

## 2015-08-06 DIAGNOSIS — Z9189 Other specified personal risk factors, not elsewhere classified: Principal | ICD-10-CM

## 2015-08-06 NOTE — Congregational Nurse Program (Signed)
Pt. Needs up to date immunizationes for residency application. CN administered flu vaccine. Referred to agency attorney and [facility]. For obtaining immunizations.

## 2018-12-14 ENCOUNTER — Other Ambulatory Visit: Payer: Self-pay | Admitting: Internal Medicine

## 2018-12-14 DIAGNOSIS — Z1231 Encounter for screening mammogram for malignant neoplasm of breast: Secondary | ICD-10-CM

## 2018-12-31 ENCOUNTER — Ambulatory Visit
Admission: RE | Admit: 2018-12-31 | Discharge: 2018-12-31 | Disposition: A | Discharge status: Home / Self Care | Payer: Self-pay | Source: Ambulatory Visit | Attending: Internal Medicine | Admitting: Internal Medicine

## 2018-12-31 DIAGNOSIS — Z1231 Encounter for screening mammogram for malignant neoplasm of breast: Secondary | ICD-10-CM

## 2020-02-05 ENCOUNTER — Other Ambulatory Visit: Payer: Self-pay

## 2020-02-05 DIAGNOSIS — Z1231 Encounter for screening mammogram for malignant neoplasm of breast: Secondary | ICD-10-CM

## 2020-03-05 ENCOUNTER — Other Ambulatory Visit: Payer: Self-pay

## 2020-03-05 ENCOUNTER — Ambulatory Visit
Admission: RE | Admit: 2020-03-05 | Discharge: 2020-03-05 | Disposition: A | Discharge status: Home / Self Care | Payer: PRIVATE HEALTH INSURANCE | Source: Ambulatory Visit | Attending: Obstetrics and Gynecology | Admitting: Obstetrics and Gynecology

## 2020-03-05 ENCOUNTER — Ambulatory Visit: Payer: Self-pay | Admitting: Medical

## 2020-03-05 ENCOUNTER — Encounter: Payer: Self-pay | Admitting: Medical

## 2020-03-05 VITALS — BP 100/64 | Temp 97.5°F | Wt 136.0 lb

## 2020-03-05 DIAGNOSIS — Z1231 Encounter for screening mammogram for malignant neoplasm of breast: Secondary | ICD-10-CM

## 2020-03-05 NOTE — Progress Notes (Signed)
Ms. Vicky Schleich is a 49 y.o. female who presents to [facility] clinic today with no complaints.    Pap Smear: Pap not smear completed today. Last Pap smear was 09/2019 at [facility] was normal. Per patient has no history of an abnormal Pap smear. Last Pap smear result is not available in Epic.   Physical exam: Breasts Breasts symmetrical. No skin abnormalities bilateral breasts. No nipple retraction bilateral breasts. No nipple discharge bilateral breasts. No lymphadenopathy. No lumps palpated bilateral breasts. Mild bilateral fibrocystic changes noted.    Pelvic/Bimanual Pap is not indicated today    Smoking History: Patient has never smoked.     Patient Navigation: Patient education provided. Access to services provided for patient through [facility] program.    Colorectal Cancer Screening: Per patient has never had colonoscopy completed No complaints today.    Breast and Cervical Cancer Risk Assessment: Patient does not have family history of breast cancer, known genetic mutations, or radiation treatment to the chest before age 73. Patient does not have history of cervical dysplasia, immunocompromised, or DES exposure in-utero.  Risk Assessment    Risk Scores      03/05/2020   Last edited by: Marny Lowenstein, PA-C   5-year risk: 0.8 %   Lifetime risk: 8 %          A: [facility] exam without pap smear  P: Referred patient to [facility] for a screening mammogram. Appointment scheduled 03/05/2020 @ 1pm.  Marny Lowenstein, PA-C 03/05/2020 10:55 AM

## 2020-03-05 NOTE — Patient Instructions (Signed)
Mamografa Mammogram Una mamografa es un radiografa de las mamas que se realiza para determinar si hay cambios que no son normales. Este estudio permite explorar y encontrar cualquier cambio que pudiera sugerir la presencia de cncer de mama. Las mamografas se realizan peridicamente en las mujeres. Un hombre puede hacerse una mamografa si tiene un bulto o hinchazn en la mama. Tambin puede ayudar a identificar otros cambios y variaciones en las mamas. Informe al mdico:  Acerca de cualquier alergia que tenga.  Si tiene implantes mamarios.  Si ha tenido enfermedades, biopsias o cirugas previas de la mama.  Si est amamantando.  Si es menor de 25aos.  Si tiene antecedentes familiares de cncer de mama.  Si est embarazada o podra estarlo. Cules son los riesgos? En general, se trata de un procedimiento seguro. Sin embargo, pueden ocurrir complicaciones, por ejemplo:  Exposicin a la radiacin. En este estudio, los niveles de radiacin son muy bajos.  La interpretacin errnea de los resultados.  La necesidad de realizar ms estudios.  La imposibilidad de la mamografa de detectar algunos tipos de cncer. Qu ocurre antes del procedimiento?  Hgase este estudio aproximadamente 1 o 2semanas despus de la menstruacin. Generalmente, este es el momento en que las mamas estn menos sensibles.  Si consulta a un mdico nuevo o cambia de clnica, enve las mamografas anteriores al nuevo consultorio.  El da del estudio, lvese las mamas y las axilas.  No use desodorantes, perfumes, lociones o talcos el da del estudio.  Qutese las alhajas del cuello.  Use prendas que pueda ponerse y sacarse fcilmente. Qu ocurre durante el procedimiento?   Se quitar la ropa de la cintura para arriba. Se colocar una bata.  Debe permanecer de pie delante de la mquina de rayos-X.  Se colocar cada mama entre dos placas de vidrio o de plstico. Las placas comprimirn las mamas  durante unos segundos. Intente estar lo ms relajada posible. Esto no causa ningn dao a las mamas. Si siente alguna molestia, ser pasajera.  Se tomarn radiografas desde diferentes ngulos de cada mama. Este procedimiento puede variar segn el mdico y el hospital. Qu ocurre despus del procedimiento?  La mamografa ser leda por un especialista (radilogo).  Tal vez deba repetir algunas partes del estudio. Esto depende de la calidad de las imgenes.  Pregunte la fecha en que los resultados estarn disponibles. Asegrese de obtener los resultados.  Puede volver a sus actividades habituales. Resumen  Una mamografa es un radiografa de baja energa de las mamas que se realiza para determinar si hay cambios anormales. Un hombre puede hacerse este examen si tiene un bulto o hinchazn en la mama.  Antes del procedimiento, informe al mdico sobre cualquier problema en las mamas que haya tenido en el pasado.  Hgase este estudio aproximadamente 1 o 2semanas despus de la menstruacin.  Para el examen, se colocar cada mama entre dos placas de vidrio o de plstico. Las placas comprimirn las mamas durante unos segundos.  La mamografa ser leda por un especialista (radilogo). Pregunte la fecha en que los resultados estarn disponibles. Asegrese de obtener los resultados. Esta informacin no tiene como fin reemplazar el consejo del mdico. Asegrese de hacerle al mdico cualquier pregunta que tenga. Document Revised: 07/17/2018 Document Reviewed: 07/17/2018 Elsevier Patient Education  2020 Elsevier Inc.  

## 2021-03-03 ENCOUNTER — Other Ambulatory Visit: Payer: Self-pay | Admitting: Obstetrics and Gynecology

## 2021-03-03 DIAGNOSIS — Z1231 Encounter for screening mammogram for malignant neoplasm of breast: Secondary | ICD-10-CM

## 2021-03-12 ENCOUNTER — Encounter: Payer: Self-pay | Admitting: Obstetrics & Gynecology

## 2021-03-12 ENCOUNTER — Other Ambulatory Visit: Payer: Self-pay

## 2021-03-12 ENCOUNTER — Ambulatory Visit (INDEPENDENT_AMBULATORY_CARE_PROVIDER_SITE_OTHER): Payer: Self-pay | Admitting: Obstetrics & Gynecology

## 2021-03-12 VITALS — BP 100/70 | Ht 59.0 in | Wt 141.0 lb

## 2021-03-12 DIAGNOSIS — N921 Excessive and frequent menstruation with irregular cycle: Secondary | ICD-10-CM

## 2021-03-12 MED ORDER — MEGESTROL ACETATE 40 MG PO TABS: 40.0000 mg | ORAL_TABLET | Freq: Two times a day (BID) | ORAL | 1 refills | Status: DC

## 2021-03-12 NOTE — Progress Notes (Signed)
    Kelly Rogers [birth year] [id number]   History:    50 y.o. G2P1A1L1  RP:  New (>3 yrs) patient presenting for Menometrorrhagia  HPI: Menometrorrhagia x 12/2020.  Pelvic cramps.  No vaginal discharge.  No fever.  Urine/BMs normal.    Past medical history,surgical history, family history and social history were all reviewed and documented in the EPIC chart.  Gynecologic History Patient's last menstrual period was 02/07/2021.  Obstetric History OB History  Gravida Para Term Preterm AB Living  1       1 1   SAB IAB Ectopic Multiple Live Births  1   0        # Outcome Date GA Lbr Len/2nd Weight Sex Delivery Anes PTL Lv  1 SAB              ROS: A ROS was performed and pertinent positives and negatives are included in the history.  GENERAL: No fevers or chills. HEENT: No change in vision, no earache, sore throat or sinus congestion. NECK: No pain or stiffness. CARDIOVASCULAR: No chest pain or pressure. No palpitations. PULMONARY: No shortness of breath, cough or wheeze. GASTROINTESTINAL: No abdominal pain, nausea, vomiting or diarrhea, melena or bright red blood per rectum. GENITOURINARY: No urinary frequency, urgency, hesitancy or dysuria. MUSCULOSKELETAL: No joint or muscle pain, no back pain, no recent trauma. DERMATOLOGIC: No rash, no itching, no lesions. ENDOCRINE: No polyuria, polydipsia, no heat or cold intolerance. No recent change in weight. HEMATOLOGICAL: No anemia or easy bruising or bleeding. NEUROLOGIC: No headache, seizures, numbness, tingling or weakness. PSYCHIATRIC: No depression, no loss of interest in normal activity or change in sleep pattern.     Exam:   BP 100/70 (BP Location: Right Arm, Patient Position: Sitting, Cuff Size: Normal)   Ht 4\' 11"  (1.499 m)   Wt 141 lb (64 kg)   LMP 02/07/2021   BMI 28.48 kg/m   Body mass index is 28.48 kg/m.  General appearance : Well developed well nourished female. No acute distress  Pelvic: Vulva: Normal              Vagina: No gross lesions or discharge  Cervix: No gross lesions or discharge.  Dark blood from the EO.  Uterus  AV, normal size, shape and consistency, non-tender and mobile  Adnexa  Without masses or tenderness  Anus: Normal   Assessment/Plan:  50 y.o. female   1. Menometrorrhagia Menometrorrhagia x 12/2020.  Pelvic cramps with bleeding.  Declines STI screen.   R/O anemia and Endocrine dysfunction such as Hyperprolactinemia and Thyroid dysfunction.  F/U Pelvic 44 to R/O Polyp, SM Myoma and Endometrial Hyperplasia.  Will control with Megestrol 40 mg PO BID in the meantime.  Usage reviewed and prescription sent to pharmacy. - CBC - TSH - Prolactin - 01/2021 Transvaginal Non-OB; Future  Other orders - Multiple Vitamin (MULTIVITAMIN) capsule; Take 1 capsule by mouth daily. - megestrol (MEGACE) 40 MG tablet; Take 1 tablet (40 mg total) by mouth 2 (two) times daily.  [place] MD, 1:55 PM 03/12/2021

## 2021-03-18 ENCOUNTER — Ambulatory Visit: Payer: Self-pay | Admitting: *Deleted

## 2021-03-18 ENCOUNTER — Other Ambulatory Visit: Payer: Self-pay

## 2021-03-18 ENCOUNTER — Ambulatory Visit
Admission: RE | Admit: 2021-03-18 | Discharge: 2021-03-18 | Disposition: A | Discharge status: Home / Self Care | Payer: No Typology Code available for payment source | Source: Ambulatory Visit | Attending: Obstetrics and Gynecology | Admitting: Obstetrics and Gynecology

## 2021-03-18 VITALS — BP 116/74 | Wt 141.8 lb

## 2021-03-18 DIAGNOSIS — Z1231 Encounter for screening mammogram for malignant neoplasm of breast: Secondary | ICD-10-CM

## 2021-03-18 DIAGNOSIS — Z1239 Encounter for other screening for malignant neoplasm of breast: Secondary | ICD-10-CM

## 2021-03-18 NOTE — Progress Notes (Signed)
[name] is a 50 y.o. female who presents to [facility] clinic today with no complaints.    Pap Smear: Pap smear not completed today. Last Pap smear was 02/16/2021 at the [facility] clinic and was normal per patient. Per patient has no history of an abnormal Pap smear. Last Pap smear result is not available in Epic.   Physical exam: Breasts Breasts symmetrical. No skin abnormalities bilateral breasts. No nipple retraction bilateral breasts. No nipple discharge bilateral breasts. No lymphadenopathy. No lumps palpated bilateral breasts. No complaints of pain or tenderness on exam.      MS DIGITAL SCREENING TOMO BILATERAL  Result Date: 03/05/2020 CLINICAL DATA:  Screening. EXAM: DIGITAL SCREENING BILATERAL MAMMOGRAM WITH TOMO AND CAD COMPARISON:  Previous exam(s). ACR Breast Density Category d: The breast tissue is extremely dense, which lowers the sensitivity of mammography FINDINGS: There are no findings suspicious for malignancy. Images were processed with CAD. IMPRESSION: No mammographic evidence of malignancy. A result letter of this screening mammogram will be mailed directly to the patient. RECOMMENDATION: Screening mammogram in one year. (Code:SM-B-01Y) BI-RADS CATEGORY  1: Negative. Electronically Signed   By: Kelly Rogers M.D.   On: 03/05/2020 17:27   MS DIGITAL SCREENING TOMO BILATERAL  Result Date: 01/01/2019 CLINICAL DATA:  Screening. EXAM: DIGITAL SCREENING BILATERAL MAMMOGRAM WITH TOMO AND CAD COMPARISON:  Previous exam(s). ACR Breast Density Category d: The breast tissue is extremely dense, which lowers the sensitivity of mammography FINDINGS: There are no findings suspicious for malignancy. Images were processed with CAD. IMPRESSION: No mammographic evidence of malignancy. A result letter of this screening mammogram will be mailed directly to the patient. RECOMMENDATION: Screening mammogram in one year. (Code:SM-B-01Y) BI-RADS CATEGORY  1: Negative.  Electronically Signed   By: Kelly Rogers M.D.   On: 01/01/2019 12:49    Pelvic/Bimanual Pap is not indicated today per BCCCP guidelines.   Smoking History: Patient has never smoked.   Patient Navigation: Patient education provided. Access to services provided for patient through Kelly Rogers program. Spanish interpreter Kelly Rogers from [facility] provided.   Colorectal Cancer Screening: Per patient has never had colonoscopy completed. No complaints today.    Breast and Cervical Cancer Risk Assessment: Patient does not have family history of breast cancer, known genetic mutations, or radiation treatment to the chest before age 64. Patient does not have history of cervical dysplasia, immunocompromised, or DES exposure in-utero.  Risk Assessment    Risk Scores      03/18/2021 03/05/2020   Last edited by: Kelly Rutherford, LPN Kelly Lowenstein, PA-C   5-year risk: 0.9 % 0.8 %   Lifetime risk: 7.9 % 8 %         A: BCCCP exam without pap smear No complaints.  P: Referred patient to the [facility] for a screening mammogram on the mobile unit. Appointment scheduled Thursday, Mar 18, 2021 at 1400.  Kelly Heidelberg, RN 03/18/2021 1:04 PM

## 2021-03-18 NOTE — Patient Instructions (Signed)
Explained breast self awareness with Leafy Half. Patient did not need a Pap smear today due to last Pap smear was 02/16/2021 per patient. Let her know BCCCP will cover Pap smears every 3 years unless has a history of abnormal Pap smears. Referred patient to the [facility] for a screening mammogram on the mobile unit. Appointment scheduled Thursday, Mar 18, 2021 at 1400. Patient escorted to the mobile unit following BCCCP appointment for her screening mammogram. Let patient know the Breast Center will follow-up with her within the next couple of weeks with results of her mammogram by letter or phone. Ginnie Garcia-Altamirano verbalized understanding.  Darianne Muralles, Kathaleen Maser, RN 1:04 PM

## 2021-03-22 ENCOUNTER — Encounter: Payer: Self-pay | Admitting: Obstetrics & Gynecology

## 2021-04-15 ENCOUNTER — Other Ambulatory Visit: Payer: Self-pay

## 2021-04-15 ENCOUNTER — Other Ambulatory Visit: Payer: Self-pay | Admitting: Obstetrics & Gynecology

## 2021-04-15 ENCOUNTER — Telehealth: Payer: Self-pay

## 2021-04-15 NOTE — Telephone Encounter (Signed)
Per front desk "Patient would like the doctor to know that she is still bleeding a bit. Shesays she is still taking the pills and they are helping but still bleeds. "

## 2021-04-15 NOTE — Telephone Encounter (Signed)
Per front desk "Patient would like the doctor to know that she is still bleeding a bit. She says she is still taking the pills and they are helping but still bleeds. "  Ultrasound scheduled 04/21/21.  At 03/12/2021 visit "1. Menometrorrhagia Menometrorrhagia x 12/2020.  Pelvic cramps with bleeding.  Declines STI screen.   R/O anemia and Endocrine dysfunction such as Hyperprolactinemia and Thyroid dysfunction.  F/U Pelvic US to R/O Polyp, SM Myoma and Endometrial Hyperplasia.  Will control with Megestrol 40 mg PO BID in the meantime.  Usage reviewed and prescription sent to pharmacy."

## 2021-04-21 ENCOUNTER — Ambulatory Visit (INDEPENDENT_AMBULATORY_CARE_PROVIDER_SITE_OTHER): Payer: Self-pay | Admitting: Obstetrics & Gynecology

## 2021-04-21 ENCOUNTER — Encounter: Payer: Self-pay | Admitting: Obstetrics & Gynecology

## 2021-04-21 ENCOUNTER — Ambulatory Visit (INDEPENDENT_AMBULATORY_CARE_PROVIDER_SITE_OTHER): Payer: Self-pay

## 2021-04-21 ENCOUNTER — Other Ambulatory Visit: Payer: Self-pay

## 2021-04-21 VITALS — BP 120/70

## 2021-04-21 DIAGNOSIS — N921 Excessive and frequent menstruation with irregular cycle: Secondary | ICD-10-CM

## 2021-04-21 LAB — PROLACTIN: Prolactin: 16.3 ng/mL

## 2021-04-21 LAB — TSH: TSH: 3.54 mIU/L

## 2021-04-21 LAB — CBC
HCT: 38.6 % (ref 35.0–45.0)
Hemoglobin: 12.5 g/dL (ref 11.7–15.5)
MCH: 31.4 pg (ref 27.0–33.0)
MCHC: 32.4 g/dL (ref 32.0–36.0)
MCV: 97 fL (ref 80.0–100.0)
MPV: 10.4 fL (ref 7.5–12.5)
Platelets: 299 10*3/uL (ref 140–400)
RBC: 3.98 10*6/uL (ref 3.80–5.10)
RDW: 12.7 % (ref 11.0–15.0)
WBC: 5.6 10*3/uL (ref 3.8–10.8)

## 2021-04-21 MED ORDER — NORETHIN ACE-ETH ESTRAD-FE 1-20 MG-MCG(24) PO TABS: 1.0000 | ORAL_TABLET | Freq: Every day | ORAL | 11 refills | Status: DC

## 2021-04-21 NOTE — Progress Notes (Signed)
    Kelly Rogers 12-06-1970 [id number]        50 y.o.  G1P0011   RP: Menometrorrhagia for Pelvic US  HPI: Seen with me on 03/12/2021:  Menometrorrhagia x 12/2020.  Pelvic cramps.  No vaginal discharge.  No fever.  Urine/BMs normal.     OB History  Gravida Para Term Preterm AB Living  1       1 1   SAB IAB Ectopic Multiple Live Births  1   0        # Outcome Date GA Lbr Len/2nd Weight Sex Delivery Anes PTL Lv  1 SAB             Past medical history,surgical history, problem list, medications, allergies, family history and social history were all reviewed and documented in the EPIC chart.   Directed ROS with pertinent positives and negatives documented in the history of present illness/assessment and plan.  Exam:  Vitals:   04/21/21 1538  BP: 120/70   General appearance:  Normal  Pelvic 04/23/21 today: T/V images.  Anteflexed uterus normal in size and shape with no myometrial mass.  The uterus is measured at 7.75 x 5.13 x 4.32 cm.  The endometrial lining is measured at 10.72 mm with no definite mass seen.  Both ovaries are normal in size with normal follicular pattern with a dominant follicle on the left ovary.  Perfusion is present at the ovaries bilaterally.  No adnexal mass.  No free fluid in the posterior cul-de-sac.   Assessment/Plan:  50 y.o. G1P0011   1. Menometrorrhagia Pelvic ultrasound findings thoroughly reviewed with patient.  Normal uterus with normal endometrial lining.  Both ovaries are normal.  No adnexal mass and no free fluid.  Patient reassured.  Stop Megestrol and start Loestrin 24 Fe 1/20 generic.  No contraindication to birth control pills.  Usage reviewed and prescription sent to pharmacy.  We will check hemoglobin with a CBC today and rule out thyroid dysfunction and hyperprolactinemia. - CBC - TSH - Prolactin  Other orders - Norethindrone Acetate-Ethinyl Estrad-FE (LOESTRIN 24 FE) 1-20 MG-MCG(24) tablet; Take 1 tablet by mouth daily.    2/20 MD, 3:49 PM 04/21/2021

## 2021-04-25 ENCOUNTER — Encounter: Payer: Self-pay | Admitting: Obstetrics & Gynecology

## 2021-04-27 NOTE — Telephone Encounter (Signed)
Patient had visit with Dr. Mackey Birchwood on 04/21/21.

## 2021-05-31 ENCOUNTER — Ambulatory Visit: Payer: Self-pay | Admitting: Obstetrics & Gynecology

## 2021-05-31 DIAGNOSIS — Z0289 Encounter for other administrative examinations: Secondary | ICD-10-CM

## 2021-12-31 ENCOUNTER — Other Ambulatory Visit: Payer: Self-pay

## 2021-12-31 ENCOUNTER — Other Ambulatory Visit (HOSPITAL_COMMUNITY)
Admission: RE | Admit: 2021-12-31 | Discharge: 2021-12-31 | Disposition: A | Discharge status: Home / Self Care | Payer: Commercial Managed Care - PPO | Source: Ambulatory Visit | Attending: Obstetrics & Gynecology | Admitting: Obstetrics & Gynecology

## 2021-12-31 ENCOUNTER — Encounter: Payer: Self-pay | Admitting: Obstetrics & Gynecology

## 2021-12-31 ENCOUNTER — Ambulatory Visit (INDEPENDENT_AMBULATORY_CARE_PROVIDER_SITE_OTHER): Payer: Commercial Managed Care - PPO | Admitting: Obstetrics & Gynecology

## 2021-12-31 VITALS — BP 116/78 | HR 72 | Resp 16 | Ht <= 58 in | Wt 136.0 lb

## 2021-12-31 DIAGNOSIS — Z3009 Encounter for other general counseling and advice on contraception: Secondary | ICD-10-CM | POA: Diagnosis not present

## 2021-12-31 DIAGNOSIS — E663 Overweight: Secondary | ICD-10-CM | POA: Diagnosis not present

## 2021-12-31 DIAGNOSIS — Z01419 Encounter for gynecological examination (general) (routine) without abnormal findings: Secondary | ICD-10-CM | POA: Diagnosis present

## 2021-12-31 NOTE — Progress Notes (Signed)
? ? ?Kelly Rogers [birth year] [id number] ? ? ?History:    51 y.o. G2P1A1L1 ? ?RP:  Established patient presenting for annual gyn exam  ? ?HPI: On LoEstrin Fe 24 1/20 from 03/2021 to control menorrhagia. Pelvic US normal in 03/2021.  Very light menses on the BCPs.  Patient stopped it 2 weeks ago because she is not sexually active and wanted to see her natural cycle.  LMP 12/05/2021 normal.  No pelvic pain.  Uncertain when her last Pap test was.  No h/o abnormal Pap.  Breasts normal.  Mammo Neg 02/2021.  Schedule Colono.  BMI 29.17.  Health labs with Fam MD.   ? ?Past medical history,surgical history, family history and social history were all reviewed and documented in the EPIC chart. ? ?Gynecologic History ?Patient's last menstrual period was 12/05/2021 (exact date). ? ?Obstetric History ?OB History  ?Gravida Para Term Preterm AB Living  ?1       1 1   ?SAB IAB Ectopic Multiple Live Births  ?1   0      ?  ?# Outcome Date GA Lbr Len/2nd Weight Sex Delivery Anes PTL Lv  ?1 SAB           ? ? ? ?ROS: A ROS was performed and pertinent positives and negatives are included in the history. ? GENERAL: No fevers or chills. HEENT: No change in vision, no earache, sore throat or sinus congestion. NECK: No pain or stiffness. CARDIOVASCULAR: No chest pain or pressure. No palpitations. PULMONARY: No shortness of breath, cough or wheeze. GASTROINTESTINAL: No abdominal pain, nausea, vomiting or diarrhea, melena or bright red blood per rectum. GENITOURINARY: No urinary frequency, urgency, hesitancy or dysuria. MUSCULOSKELETAL: No joint or muscle pain, no back pain, no recent trauma. DERMATOLOGIC: No rash, no itching, no lesions. ENDOCRINE: No polyuria, polydipsia, no heat or cold intolerance. No recent change in weight. HEMATOLOGICAL: No anemia or easy bruising or bleeding. NEUROLOGIC: No headache, seizures, numbness, tingling or weakness. PSYCHIATRIC: No depression, no loss of interest in normal activity or change in sleep  pattern.  ?  ? ?Exam: ? ? ?BP 116/78   Pulse 72   Resp 16   Ht 4' 9.25" (1.454 m)   Wt 136 lb (61.7 kg)   LMP 12/05/2021 (Exact Date) Comment: stopped birth control 2 weeks ago  BMI 29.17 kg/m?  ? ?Body mass index is 29.17 kg/m?. ? ?General appearance : Well developed well nourished female. No acute distress ?HEENT: Eyes: no retinal hemorrhage or exudates,  Neck supple, trachea midline, no carotid bruits, no thyroidmegaly ?Lungs: Clear to auscultation, no rhonchi or wheezes, or rib retractions  ?Heart: Regular rate and rhythm, no murmurs or gallops ?Breast:Examined in sitting and supine position were symmetrical in appearance, no palpable masses or tenderness,  no skin retraction, no nipple inversion, no nipple discharge, no skin discoloration, no axillary or supraclavicular lymphadenopathy ?Abdomen: no palpable masses or tenderness, no rebound or guarding ?Extremities: no edema or skin discoloration or tenderness ? ?Pelvic: Vulva: Normal ?            Vagina: No gross lesions or discharge ? Cervix: No gross lesions or discharge.  Pap reflex done. ? Uterus  AV, normal size, shape and consistency, non-tender and mobile ? Adnexa  Without masses or tenderness ? Anus: Normal ? ? ?Assessment/Plan:  51 y.o. female for annual exam  ? ?1. Encounter for routine gynecological examination with Papanicolaou smear of cervix ?On LoEstrin Fe 24 1/20 from 03/2021 to control menorrhagia. Pelvic US  normal in 03/2021.  Very light menses on the BCPs.  Patient stopped it 2 weeks ago because she is not sexually active and wanted to see her natural cycle.  LMP 12/05/2021 normal.  No pelvic pain.  Uncertain when her last Pap test was.  No h/o abnormal Pap.  Pap reflex today. Breasts normal.  Mammo Neg 02/2021.  Schedule Colono.  BMI 29.17.  Health labs with Fam MD.   ?- Cytology - PAP( [place]) ? ?2. General counselling and advice on contraception ?Stopped the BCPs 2 weeks ago, prefers to see what her natural cycle is like at this  time.  Was on BCPs to control Menorrhagia.  Pelvic US Normal in 03/2021.  Will call if develops abnormal bleeding again.  Condoms if sexually active. ? ?3. Overweight (BMI 25.0-29.9)  ?Lower calorie/carb diet.  Fitness activities to increase. ? ?Princess Bruins MD, 11:38 AM 12/31/2021 ? ?  ?

## 2022-01-05 LAB — CYTOLOGY - PAP
Comment: NEGATIVE
Diagnosis: NEGATIVE
High risk HPV: NEGATIVE

## 2022-05-11 ENCOUNTER — Other Ambulatory Visit: Payer: Self-pay | Admitting: Obstetrics & Gynecology

## 2022-05-11 DIAGNOSIS — Z1231 Encounter for screening mammogram for malignant neoplasm of breast: Secondary | ICD-10-CM

## 2022-05-24 ENCOUNTER — Ambulatory Visit
Admission: RE | Admit: 2022-05-24 | Discharge: 2022-05-24 | Disposition: A | Discharge status: Home / Self Care | Payer: Commercial Managed Care - PPO | Source: Ambulatory Visit | Attending: Obstetrics & Gynecology | Admitting: Obstetrics & Gynecology

## 2022-05-24 DIAGNOSIS — Z1231 Encounter for screening mammogram for malignant neoplasm of breast: Secondary | ICD-10-CM

## 2022-07-27 ENCOUNTER — Encounter: Payer: Self-pay | Admitting: Obstetrics & Gynecology

## 2022-07-27 ENCOUNTER — Ambulatory Visit (INDEPENDENT_AMBULATORY_CARE_PROVIDER_SITE_OTHER): Payer: Managed Care, Other (non HMO) | Admitting: Obstetrics & Gynecology

## 2022-07-27 VITALS — BP 120/80 | HR 66

## 2022-07-27 DIAGNOSIS — N921 Excessive and frequent menstruation with irregular cycle: Secondary | ICD-10-CM | POA: Diagnosis not present

## 2022-07-27 DIAGNOSIS — N914 Secondary oligomenorrhea: Secondary | ICD-10-CM

## 2022-07-27 DIAGNOSIS — N926 Irregular menstruation, unspecified: Secondary | ICD-10-CM | POA: Diagnosis not present

## 2022-07-27 LAB — PREGNANCY, URINE: Preg Test, Ur: NEGATIVE

## 2022-07-27 LAB — CBC
MCHC: 34.5 g/dL (ref 32.0–36.0)
MPV: 10.2 fL (ref 7.5–12.5)
RBC: 3.67 10*6/uL — ABNORMAL LOW (ref 3.80–5.10)
RDW: 13.2 % (ref 11.0–15.0)

## 2022-07-27 MED ORDER — NORETHINDRONE 0.35 MG PO TABS: 1.0000 | ORAL_TABLET | Freq: Every day | ORAL | 4 refills | Status: DC

## 2022-07-27 NOTE — Progress Notes (Signed)
    Kelly Rogers [birth year] [id number]        51 y.o.  G2P1A1L1 Married  RP: Oligo/Menometrorrhagia x 01/2022  HPI: Stopped BCPs in 12/2021.  Had Oligo with no menses x 3 months from May to July 2023.  Then started having heavy vaginal bleeding on 06/16/22 which continued everyday with variable flow.  No vaginal bleeding x yesterday.  No pelvic pain.  C/O intermittent hot flushes.  Not using contraception, but abstinent because of vaginal bleeding since mid August.   OB History  Gravida Para Term Preterm AB Living  2 1 1   1 1   SAB IAB Ectopic Multiple Live Births  1   0        # Outcome Date GA Lbr Len/2nd Weight Sex Delivery Anes PTL Lv  2 Term           1 SAB             Past medical history,surgical history, problem list, medications, allergies, family history and social history were all reviewed and documented in the EPIC chart.   Directed ROS with pertinent positives and negatives documented in the history of present illness/assessment and plan.  Exam:  Vitals:   07/27/22 0941  BP: 120/80  Pulse: 66  SpO2: 99%   General appearance:  Normal  Abdomen: Normal  Gynecologic exam: Vulva normal.  Speculum:  Cervix/Vagina normal.  No abnormal vaginal discharge, no blood.  Bimanual exam:  Uterus AV, normal volume, mobile, NT.  No adnexal mass, NT.  UPT Neg   Assessment/Plan:  51 y.o. G2P1011   1. Menometrorrhagia Stopped BCPs in 12/2021.  Had Oligo with no menses x 3 months from May to July 2023.  Then started having heavy vaginal bleeding on 06/16/22 which continued everyday with variable flow.  No vaginal bleeding x yesterday.  No pelvic pain.  C/O intermittent hot flushes.  Not using contraception, but abstinent because of vaginal bleeding since mid August.  Normal Gyn exam today.  Will r/o anemia with a CBC today.  Probable Perimenopause, check FSH.  F/U with a Pelvic US to further investigate the irregular bleeding.  Start the Progestin only pill to control  the bleeding, contraception and protect the endometrium.  Usage reviewed.  Prescription sent to pharmacy. - CBC - US Transvaginal Non-OB; Future  2. Secondary oligomenorrhea Probable perimenopause.  Check FSH.  Start on the Progestin pill. - FSH  3. Irregular menstrual cycle UPT Neg - Pregnancy, urine  Other orders - norethindrone (MICRONOR) 0.35 MG tablet; Take 1 tablet (0.35 mg total) by mouth daily.   Princess Bruins MD, 10:06 AM 07/27/2022

## 2022-07-28 LAB — CBC
HCT: 34.5 % — ABNORMAL LOW (ref 35.0–45.0)
Hemoglobin: 11.9 g/dL (ref 11.7–15.5)
MCH: 32.4 pg (ref 27.0–33.0)
MCV: 94 fL (ref 80.0–100.0)
Platelets: 288 10*3/uL (ref 140–400)
WBC: 4.1 10*3/uL (ref 3.8–10.8)

## 2022-07-28 LAB — FOLLICLE STIMULATING HORMONE: FSH: 23.6 m[IU]/mL

## 2022-09-15 ENCOUNTER — Ambulatory Visit: Payer: Managed Care, Other (non HMO)

## 2022-09-15 ENCOUNTER — Other Ambulatory Visit: Payer: Managed Care, Other (non HMO) | Admitting: Obstetrics & Gynecology

## 2022-09-15 ENCOUNTER — Telehealth: Payer: Self-pay | Admitting: *Deleted

## 2022-09-15 NOTE — Telephone Encounter (Signed)
Patient called spoke with Debarah Crape, patient has been taking Micronor 0.35 mg tablet twice daily since 07/28/22. It appears the office called patient and spoke with her via interpreter line to tell her to increase iron supplement and patient thought this meant the Micronor. Debarah Crape will relay that she only needs to take pill 1 tablet daily, not 2 tablets per day. She can't pickup a refill now, because its too early to pick up due to insurance.   Please let me know if you want to me have Claudia relay anything else to patient. Please advise

## 2022-09-16 NOTE — Telephone Encounter (Signed)
Staff message to [place] to relay in spanish.

## 2022-09-19 NOTE — Telephone Encounter (Signed)
-----   Message from [facility] sent at 09/15/2022 10:11 AM EST ----- GM, pt is wanting to get clarification on medication she is currently taking.  States she was taking one pill a day but received a call from our office stating she needed to take 2 instead but ran out and when wanting to pickup next refill from pharmacy was informed it was to soon for her to pick up medication.  Please advise.

## 2022-09-19 NOTE — Telephone Encounter (Signed)
Call placed to [facility].  Was advised Rx for Micronor picked up 09/11/22 with discount card. Patient can use discount card at anytime to pick up next RX.   Routing to [place] to return call to patient.

## 2022-10-13 ENCOUNTER — Other Ambulatory Visit: Payer: Managed Care, Other (non HMO) | Admitting: Obstetrics & Gynecology

## 2022-10-13 ENCOUNTER — Other Ambulatory Visit: Payer: Managed Care, Other (non HMO)

## 2022-11-17 ENCOUNTER — Ambulatory Visit (INDEPENDENT_AMBULATORY_CARE_PROVIDER_SITE_OTHER): Payer: Managed Care, Other (non HMO)

## 2022-11-17 ENCOUNTER — Ambulatory Visit (INDEPENDENT_AMBULATORY_CARE_PROVIDER_SITE_OTHER): Payer: Managed Care, Other (non HMO) | Admitting: Obstetrics & Gynecology

## 2022-11-17 ENCOUNTER — Encounter: Payer: Self-pay | Admitting: Obstetrics & Gynecology

## 2022-11-17 VITALS — BP 118/72 | HR 69

## 2022-11-17 DIAGNOSIS — N921 Excessive and frequent menstruation with irregular cycle: Secondary | ICD-10-CM | POA: Diagnosis not present

## 2022-11-17 NOTE — Progress Notes (Signed)
    Kelly Rogers Jul 19, 1971 [id number]        52 y.o.  G2P1011   RP: Menometrorrhagia for Pelvic US  HPI: Started on the Progestin only BCP in 07/2022.  Light menses, no BTB since started on the Progestin pill.  LMP 10/19/22. Currently abstinent. Seen in 07/2022: Had Oligo with no menses x 3 months from May to July 2023.  Then started having heavy vaginal bleeding on 06/16/22 which continued everyday with variable flow.  No pelvic pain.  C/O intermittent hot flushes.     OB History  Gravida Para Term Preterm AB Living  2 1 1   1 1   SAB IAB Ectopic Multiple Live Births  1   0        # Outcome Date GA Lbr Len/2nd Weight Sex Delivery Anes PTL Lv  2 Term           1 SAB             Past medical history,surgical history, problem list, medications, allergies, family history and social history were all reviewed and documented in the EPIC chart.   Directed ROS with pertinent positives and negatives documented in the history of present illness/assessment and plan.  Exam:  Vitals:   11/17/22 1000  BP: 118/72  Pulse: 69  SpO2: 98%   General appearance:  Normal  Pelvic US today: Comparison is made with previous scan in June 2022.  T/V images.  Anteverted uterus normal in size and shape with 2 small intramural fibroids which are stable since the June 2022 scan.  The uterus is measured at 7.53 x 4.9 x 4.07 cm.  The fibroids are 1.35 cm and 0.85 cm.  The endometrial lining is thin and symmetrical measured at 3.5 mm with no mass or thickening or abnormal blood flow seen.  Both ovaries are small with atrophic appearance.  A 1 cm avascular probably resolving corpus luteum is present on the left ovary.  No adnexal mass.  No free fluid in the pelvis.  No significant change since the previous scan.   Assessment/Plan:  51 y.o. G2P1011   1. Menometrorrhagia  Started on the Progestin only BCP in 07/2022.  Light menses, no BTB since started on the Progestin pill.  LMP 10/19/22. Currently  abstinent. Seen in 07/2022: Had Oligo with no menses x 3 months from May to July 2023.  Then started having heavy vaginal bleeding on 06/16/22 which continued everyday with variable flow.  No pelvic pain.  C/O intermittent hot flushes.   Pelvic US findings thoroughly reviewed with patient.  Reassured that her endometrial line is thin and normal.  A very small and stable Fibroid is present.  Ovaries are normal.  Will continue on the Progestin only pill.  F/U Annual Gyn exam this spring.    Princess Bruins MD, 10:02 AM 11/17/2022

## 2023-01-06 ENCOUNTER — Ambulatory Visit: Payer: Commercial Managed Care - PPO | Admitting: Obstetrics & Gynecology

## 2023-03-16 ENCOUNTER — Encounter: Payer: Self-pay | Admitting: Obstetrics & Gynecology

## 2023-03-16 ENCOUNTER — Ambulatory Visit (INDEPENDENT_AMBULATORY_CARE_PROVIDER_SITE_OTHER): Payer: Managed Care, Other (non HMO) | Admitting: Obstetrics & Gynecology

## 2023-03-16 VITALS — BP 110/68 | HR 63 | Ht <= 58 in | Wt 126.0 lb

## 2023-03-16 DIAGNOSIS — Z01419 Encounter for gynecological examination (general) (routine) without abnormal findings: Secondary | ICD-10-CM | POA: Diagnosis not present

## 2023-03-16 DIAGNOSIS — Z3041 Encounter for surveillance of contraceptive pills: Secondary | ICD-10-CM | POA: Diagnosis not present

## 2023-03-16 MED ORDER — NORETHINDRONE 0.35 MG PO TABS: 1.0000 | ORAL_TABLET | Freq: Every day | ORAL | 4 refills | Status: DC

## 2023-03-16 NOTE — Progress Notes (Signed)
Kelly Rogers 23-Jan-1971 [id number]   History:    52 y.o. G2P1A1L1 Married   RP:  Established patient presenting for annual gyn exam    HPI: Well on Micronor.  No BTB.  No pelvic pain. Sexually active, dryness with IC, recommend coconut oil.  Pap Neg 12/2021.  No h/o abnormal Pap.  Repeat Pap at 3 years. Breasts normal.  Mammo Neg 04/2022.  Schedule Colono.  BMI 27.03.  Health labs with Fam MD.     Past medical history,surgical history, family history and social history were all reviewed and documented in the EPIC chart.  Gynecologic History Patient's last menstrual period was 03/05/2023 (exact date).  Obstetric History OB History  Gravida Para Term Preterm AB Living  2 1 1   1 1   SAB IAB Ectopic Multiple Live Births  1   0        # Outcome Date GA Lbr Len/2nd Weight Sex Delivery Anes PTL Lv  2 Term           1 SAB              ROS: A ROS was performed and pertinent positives and negatives are included in the history. GENERAL: No fevers or chills. HEENT: No change in vision, no earache, sore throat or sinus congestion. NECK: No pain or stiffness. CARDIOVASCULAR: No chest pain or pressure. No palpitations. PULMONARY: No shortness of breath, cough or wheeze. GASTROINTESTINAL: No abdominal pain, nausea, vomiting or diarrhea, melena or bright red blood per rectum. GENITOURINARY: No urinary frequency, urgency, hesitancy or dysuria. MUSCULOSKELETAL: No joint or muscle pain, no back pain, no recent trauma. DERMATOLOGIC: No rash, no itching, no lesions. ENDOCRINE: No polyuria, polydipsia, no heat or cold intolerance. No recent change in weight. HEMATOLOGICAL: No anemia or easy bruising or bleeding. NEUROLOGIC: No headache, seizures, numbness, tingling or weakness. PSYCHIATRIC: No depression, no loss of interest in normal activity or change in sleep pattern.     Exam:   BP 110/68   Pulse 63   Ht 4' 9.25" (1.454 m)   Wt 126 lb (57.2 kg)   LMP 03/05/2023 (Exact Date) Comment:  micronor-sexually active  SpO2 99%   BMI 27.03 kg/m   Body mass index is 27.03 kg/m.  General appearance : Well developed well nourished female. No acute distress HEENT: Eyes: no retinal hemorrhage or exudates,  Neck supple, trachea midline, no carotid bruits, no thyroidmegaly Lungs: Clear to auscultation, no rhonchi or wheezes, or rib retractions  Heart: Regular rate and rhythm, no murmurs or gallops Breast:Examined in sitting and supine position were symmetrical in appearance, no palpable masses or tenderness,  no skin retraction, no nipple inversion, no nipple discharge, no skin discoloration, no axillary or supraclavicular lymphadenopathy Abdomen: no palpable masses or tenderness, no rebound or guarding Extremities: no edema or skin discoloration or tenderness  Pelvic: Vulva: Normal             Vagina: No gross lesions or discharge  Cervix: No gross lesions or discharge  Uterus  AV, normal size, shape and consistency, non-tender and mobile  Adnexa  Without masses or tenderness  Anus: Normal   Assessment/Plan:  52 y.o. female for annual exam   1. Well female exam with routine gynecological exam Well on Micronor.  No BTB.  No pelvic pain. Sexually active, dryness with IC, recommend coconut oil.  Pap Neg 12/2021.  No h/o abnormal Pap.  Repeat Pap at 3 years. Breasts normal.  Mammo Neg 04/2022.  Schedule  Colono.  BMI 27.03.  Health labs with Fam MD.    2. Encounter for surveillance of contraceptive pills Well on Micronor.  No BTB.  No pelvic pain. Sexually active, dryness with IC, recommend coconut oil.    Other orders - norethindrone (MICRONOR) 0.35 MG tablet; Take 1 tablet (0.35 mg total) by mouth daily.   Genia Del MD, 9:12 AM

## 2023-07-27 ENCOUNTER — Other Ambulatory Visit: Payer: Self-pay | Admitting: Obstetrics & Gynecology

## 2023-07-27 NOTE — Telephone Encounter (Signed)
Med refill request: Micronor 0.35 mg  Last AEX: 03/16/23 -ML Next AEX: Not scheduled Last MMG (if hormonal med) 05/04/22, BiRads 1 neg  Message to pharmacy, needs updated MMG for additional RF  Rx pended #[id number] Refill authorized: Please Advise?  Routing to covering provider.

## 2023-08-10 ENCOUNTER — Telehealth: Payer: Self-pay

## 2023-08-10 NOTE — Telephone Encounter (Signed)
Pt's spouse Darreld Mclean) left VM in triage line stating that pt had an inquiry about a prescription.

## 2023-08-11 NOTE — Telephone Encounter (Signed)
Spoke with patients spouse, Fayrene Fearing, ok per dpr. States Rx for POP was discontinued by pharmacy. He is unsure why. Advised I will call pharmacy and return call.    Spoke with [facility], confirmed Rx for norethindrone on file, will be ready to be picked up at 8am 08/12/23.   Patients spouse notified. Also reminded patient will need screening MMG updated. Information provided for scheduling.   Routing to provider for final review. Patient is agreeable to disposition. Will close encounter.

## 2024-04-02 ENCOUNTER — Other Ambulatory Visit: Payer: Self-pay | Admitting: Obstetrics & Gynecology

## 2024-04-02 NOTE — Telephone Encounter (Signed)
 Medication refill request: micronor  Last AEX:  03-16-23 Next AEX: not scheduled, message sent to scheduling department to contact her Last MMG (if hormonal medication request): 05-24-22 birads 1:neg Refill authorized: please approve if appropriate

## 2024-05-09 ENCOUNTER — Encounter: Payer: Self-pay | Admitting: Radiology

## 2024-05-09 ENCOUNTER — Ambulatory Visit (INDEPENDENT_AMBULATORY_CARE_PROVIDER_SITE_OTHER): Payer: Self-pay | Admitting: Radiology

## 2024-05-09 VITALS — BP 138/74 | HR 68 | Ht <= 58 in | Wt 139.4 lb

## 2024-05-09 DIAGNOSIS — Z01419 Encounter for gynecological examination (general) (routine) without abnormal findings: Secondary | ICD-10-CM

## 2024-05-09 DIAGNOSIS — Z1331 Encounter for screening for depression: Secondary | ICD-10-CM

## 2024-05-09 NOTE — Progress Notes (Signed)
   Kelly Rogers 12/13/1970 [id number]   History: Postmenopausal 53 y.o. presents for annual exam. No gyn concerns. LMP >4 months ago after stopping POPs. Occasional hot flashes.No other gyn concerns. Does not have insurance, worried about cost of mammogram.   Gynecologic History Postmenopausal  Last Pap: 2023. Results were: normal Last mammogram: 2023. Results were: normal Last colonoscopy: within 10 years    Obstetric History OB History  Gravida Para Term Preterm AB Living  2 1 1  1 1   SAB IAB Ectopic Multiple Live Births  1  0      # Outcome Date GA Lbr Len/2nd Weight Sex Type Anes PTL Lv  2 Term           1 SAB                05/09/2024   10:42 AM  Depression screen PHQ 2/9  Decreased Interest 0  Down, Depressed, Hopeless 0  PHQ - 2 Score 0     The following portions of the patient's history were reviewed and updated as appropriate: allergies, current medications, past family history, past medical history, past social history, past surgical history, and problem list.  Review of Systems Pertinent items noted in HPI and remainder of comprehensive ROS otherwise negative.  Past medical history, past surgical history, family history and social history were all reviewed and documented in the EPIC chart.  Exam:  Vitals:   05/09/24 1040  BP: 138/74  Pulse: 68  SpO2: 98%  Weight: 139 lb 6.4 oz (63.2 kg)  Height: 4' 9.48 (1.46 m)   Body mass index is 29.66 kg/m.  General appearance:  Normal Thyroid:  Symmetrical, normal in size, without palpable masses or nodularity. Respiratory  Auscultation:  Clear without wheezing or rhonchi Cardiovascular  Auscultation:  Regular rate, without rubs, murmurs or gallops  Edema/varicosities:  Not grossly evident Abdominal  Soft,nontender, without masses, guarding or rebound.  Liver/spleen:  No organomegaly noted  Hernia:  None appreciated  Skin  Inspection:  Grossly normal Breasts: Examined lying and  sitting.   Right: Without masses, retractions, nipple discharge or axillary adenopathy.   Left: Without masses, retractions, nipple discharge or axillary adenopathy. Genitourinary   Inguinal/mons:  Normal without inguinal adenopathy  External genitalia:  Normal appearing vulva with no masses, tenderness, or lesions  BUS/Urethra/Skene's glands:  Normal  Vagina:  Normal appearing with normal color and discharge, no lesions. Atrophy: mild   Cervix:  Normal appearing without discharge or lesions  Uterus:  Normal in size, shape and contour.  Midline and mobile, nontender  Adnexa/parametria:     Rt: Normal in size, without masses or tenderness.   Lt: Normal in size, without masses or tenderness.  Anus and perineum: Normal    Kelly Rogers, CMA present for exam  Assessment/Plan:   1. Well woman exam with routine gynecological exam (Primary) Pap 2026 Recommend she contact the Health Department for low/no cost mammogram  2. Depression screening negative    Discussed SBE, colonoscopy and DEXA screening as directed. Recommend of exercise weekly, including weight bearing exercise. Encouraged the use of seatbelts and sunscreen.  Return in 1 year for annual or sooner prn.  Kelly Rogers WHNP-BC, 11:09 AM 05/09/2024

## 2024-05-09 NOTE — Patient Instructions (Signed)
 Preventive Care 16-55 Years Old, Female  Preventive care refers to lifestyle choices and visits with your health care provider that can promote health and wellness. Preventive care visits are also called wellness exams.  What can I expect for my preventive care visit?  Counseling  Your health care provider may ask you questions about your:  Medical history, including:  Past medical problems.  Family medical history.  Pregnancy history.  Current health, including:  Menstrual cycle.  Method of birth control.  Emotional well-being.  Home life and relationship well-being.  Sexual activity and sexual health.  Lifestyle, including:  Alcohol, nicotine or tobacco, and drug use.  Access to firearms.  Diet, exercise, and sleep habits.  Work and work Astronomer.  Sunscreen use.  Safety issues such as seatbelt and bike helmet use.  Physical exam  Your health care provider will check your:  Height and weight. These may be used to calculate your BMI (body mass index). BMI is a measurement that tells if you are at a healthy weight.  Waist circumference. This measures the distance around your waistline. This measurement also tells if you are at a healthy weight and may help predict your risk of certain diseases, such as type 2 diabetes and high blood pressure.  Heart rate and blood pressure.  Body temperature.  Skin for abnormal spots.  What immunizations do I need?    Vaccines are usually given at various ages, according to a schedule. Your health care provider will recommend vaccines for you based on your age, medical history, and lifestyle or other factors, such as travel or where you work.  What tests do I need?  Screening  Your health care provider may recommend screening tests for certain conditions. This may include:  Lipid and cholesterol levels.  Diabetes screening. This is done by checking your blood sugar (glucose) after you have not eaten for a while (fasting).  Pelvic exam and Pap test.  Hepatitis B test.  Hepatitis C  test.  HIV (human immunodeficiency virus) test.  STI (sexually transmitted infection) testing, if you are at risk.  Lung cancer screening.  Colorectal cancer screening.  Mammogram. Talk with your health care provider about when you should start having regular mammograms. This may depend on whether you have a family history of breast cancer.  BRCA-related cancer screening. This may be done if you have a family history of breast, ovarian, tubal, or peritoneal cancers.  Bone density scan. This is done to screen for osteoporosis.  Talk with your health care provider about your test results, treatment options, and if necessary, the need for more tests.  Follow these instructions at home:  Eating and drinking    Eat a diet that includes fresh fruits and vegetables, whole grains, lean protein, and low-fat dairy products.  Take vitamin and mineral supplements as recommended by your health care provider.  Do not drink alcohol if:  Your health care provider tells you not to drink.  You are pregnant, may be pregnant, or are planning to become pregnant.  If you drink alcohol:  Limit how much you have to 0-1 drink a day.  Know how much alcohol is in your drink. In the [place] one drink equals one 12 oz bottle of beer (355 mL), one 5 oz glass of wine (148 mL), or one 1 oz glass of hard liquor (44 mL).  Lifestyle  Brush your teeth every morning and night with fluoride toothpaste. Floss one time each day.  Exercise for at least  30 minutes 5 or more days each week.  Do not use any products that contain nicotine or tobacco. These products include cigarettes, chewing tobacco, and vaping devices, such as e-cigarettes. If you need help quitting, ask your health care provider.  Do not use drugs.  If you are sexually active, practice safe sex. Use a condom or other form of protection to prevent STIs.  If you do not wish to become pregnant, use a form of birth control. If you plan to become pregnant, see your health care provider for a  prepregnancy visit.  Take aspirin only as told by your health care provider. Make sure that you understand how much to take and what form to take. Work with your health care provider to find out whether it is safe and beneficial for you to take aspirin daily.  Find healthy ways to manage stress, such as:  Meditation, yoga, or listening to music.  Journaling.  Talking to a trusted person.  Spending time with friends and family.  Minimize exposure to UV radiation to reduce your risk of skin cancer.  Safety  Always wear your seat belt while driving or riding in a vehicle.  Do not drive:  If you have been drinking alcohol. Do not ride with someone who has been drinking.  When you are tired or distracted.  While texting.  If you have been using any mind-altering substances or drugs.  Wear a helmet and other protective equipment during sports activities.  If you have firearms in your house, make sure you follow all gun safety procedures.  Seek help if you have been physically or sexually abused.  What's next?  Visit your health care provider once a year for an annual wellness visit.  Ask your health care provider how often you should have your eyes and teeth checked.  Stay up to date on all vaccines.  This information is not intended to replace advice given to you by your health care provider. Make sure you discuss any questions you have with your health care provider.  Document Revised: 04/14/2021 Document Reviewed: 04/14/2021  Elsevier Patient Education  2024 [employer].

## 2025-05-14 ENCOUNTER — Ambulatory Visit: Payer: Self-pay | Admitting: Radiology
